# Patient Record
Sex: Female | Born: 1960 | Race: White | Hispanic: No | Smoking: Never smoker
Health system: Southern US, Community
[De-identification: ages and names within clinical notes are randomized; demographics above are authoritative.]

## PROBLEM LIST (undated history)

## (undated) DIAGNOSIS — N3946 Mixed incontinence: Secondary | ICD-10-CM

## (undated) DIAGNOSIS — F419 Anxiety disorder, unspecified: Secondary | ICD-10-CM

## (undated) DIAGNOSIS — E669 Obesity, unspecified: Secondary | ICD-10-CM

## (undated) DIAGNOSIS — F32A Depression, unspecified: Secondary | ICD-10-CM

## (undated) DIAGNOSIS — N3281 Overactive bladder: Secondary | ICD-10-CM

## (undated) DIAGNOSIS — I00 Rheumatic fever without heart involvement: Secondary | ICD-10-CM

## (undated) DIAGNOSIS — L920 Granuloma annulare: Secondary | ICD-10-CM

## (undated) DIAGNOSIS — F329 Major depressive disorder, single episode, unspecified: Secondary | ICD-10-CM

## (undated) HISTORY — DX: Rheumatic fever without heart involvement: I00

## (undated) HISTORY — DX: Anxiety disorder, unspecified: F41.9

## (undated) HISTORY — DX: Mixed incontinence: N39.46

## (undated) HISTORY — DX: Major depressive disorder, single episode, unspecified: F32.9

## (undated) HISTORY — DX: Overactive bladder: N32.81

## (undated) HISTORY — DX: Obesity, unspecified: E66.9

## (undated) HISTORY — PX: NASAL SINUS SURGERY: SHX719

## (undated) HISTORY — DX: Depression, unspecified: F32.A

## (undated) HISTORY — DX: Granuloma annulare: L92.0

---

## 2004-06-27 ENCOUNTER — Ambulatory Visit: Payer: Self-pay | Admitting: Unknown Physician Specialty

## 2005-06-30 ENCOUNTER — Ambulatory Visit: Payer: Self-pay | Admitting: Unknown Physician Specialty

## 2006-07-02 ENCOUNTER — Ambulatory Visit: Payer: Self-pay | Admitting: Unknown Physician Specialty

## 2007-07-04 ENCOUNTER — Ambulatory Visit: Payer: Self-pay | Admitting: Unknown Physician Specialty

## 2008-07-06 ENCOUNTER — Ambulatory Visit: Payer: Self-pay | Admitting: Unknown Physician Specialty

## 2008-07-08 ENCOUNTER — Ambulatory Visit: Payer: Self-pay | Admitting: Unknown Physician Specialty

## 2009-07-12 ENCOUNTER — Ambulatory Visit: Payer: Self-pay | Admitting: Unknown Physician Specialty

## 2010-07-28 ENCOUNTER — Ambulatory Visit: Payer: Self-pay | Admitting: Unknown Physician Specialty

## 2011-11-09 ENCOUNTER — Ambulatory Visit: Payer: Self-pay | Admitting: Gastroenterology

## 2011-11-14 ENCOUNTER — Ambulatory Visit: Payer: Self-pay | Admitting: Unknown Physician Specialty

## 2013-09-11 HISTORY — PX: COLONOSCOPY: SHX174

## 2016-03-09 ENCOUNTER — Encounter: Payer: Self-pay | Admitting: Primary Care

## 2016-03-09 ENCOUNTER — Ambulatory Visit (INDEPENDENT_AMBULATORY_CARE_PROVIDER_SITE_OTHER): Payer: Managed Care, Other (non HMO) | Admitting: Primary Care

## 2016-03-09 VITALS — BP 110/70 | HR 63 | Temp 97.8°F | Ht 62.0 in | Wt 200.4 lb

## 2016-03-09 DIAGNOSIS — Z7189 Other specified counseling: Secondary | ICD-10-CM

## 2016-03-09 DIAGNOSIS — F32A Depression, unspecified: Secondary | ICD-10-CM | POA: Insufficient documentation

## 2016-03-09 DIAGNOSIS — F418 Other specified anxiety disorders: Secondary | ICD-10-CM

## 2016-03-09 DIAGNOSIS — F329 Major depressive disorder, single episode, unspecified: Secondary | ICD-10-CM | POA: Insufficient documentation

## 2016-03-09 DIAGNOSIS — F419 Anxiety disorder, unspecified: Secondary | ICD-10-CM

## 2016-03-09 DIAGNOSIS — IMO0002 Reserved for concepts with insufficient information to code with codable children: Secondary | ICD-10-CM

## 2016-03-09 MED ORDER — ATOVAQUONE-PROGUANIL HCL 250-100 MG PO TABS: 1.0000 | ORAL_TABLET | Freq: Every day | ORAL | Status: DC

## 2016-03-09 MED ORDER — TYPHOID VACCINE PO CPDR: 1.0000 | DELAYED_RELEASE_CAPSULE | ORAL | Status: DC

## 2016-03-09 NOTE — Progress Notes (Signed)
Subjective:    Patient ID: Bridget Villegas, female    DOB: Feb 03, 1961, 55 y.o.   MRN: [id number]  HPI  Bridget Villegas is a 55 year old female who presents today to establish care and discuss the problems mentioned below. Will obtain old records.  1) Anxiety and Depression: Currently managed on Zoloft 100 mg, Rexulti 2 mg, and Clonazepam 0.5 mg. She is currently followed by Psychiatry (Dr. Maryruth BunKapur) and also with therapy. Long history of anxiety and depression since adolescence, moreso worse since 2012 after the passing of her father. Follows with Dr. Maryruth BunKapur every 3 months, and therapy once monthly. Overall she feels well managed.  2) Travel Vaccinations: She will be leaving for a mission trip to [place] on 03/26/16 and would like to discuss prophylactic vaccinations. [facility]. 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  Review of Systems  Constitutional: Negative for unexpected weight change.  Respiratory: Negative for shortness of breath.   Cardiovascular: Negative for chest pain.  Skin: Negative for rash.  Psychiatric/Behavioral: Negative for suicidal ideas. The patient is not nervous/anxious.        Past Medical History  Diagnosis Date  . Anxiety and depression   . Obesity   . Rheumatic fever      Social History   Social History  . Marital Status: Legally Separated    Spouse Name: N/A  . Number of Children: N/A  . Years of Education: N/A   Occupational History  . Not on file.   Social History Main Topics  . Smoking status: Never Smoker   . Smokeless tobacco: Not on file  . Alcohol Use: No  . Drug Use: No  . Sexual Activity: Not on file   Other Topics Concern  . Not on file   Social History Narrative   Divorced.   Twin girls, 1 son.   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    Past Surgical History    Procedure Laterality Date  . Cesarean section      Family History  Problem Relation Age of Onset  . Heart attack Father   . Hypertension Mother   . Alzheimer's disease Mother   . Heart disease Father   . Diabetes Father     No Known Allergies  No current outpatient prescriptions on file prior to visit.   No current facility-administered medications on file prior to visit.    BP 110/70 mmHg  Pulse 63  Temp(Src) 97.8 F (36.6 C) (Oral)  Ht 5\' 2"  (1.575 m)  Wt 200 lb 6.4 oz (90.901 kg)  BMI 36.64 kg/m2  SpO2 98%    Objective:   Physical Exam  Constitutional: She appears well-nourished.  Neck: Neck supple.  Cardiovascular: Normal rate and regular rhythm.   Pulmonary/Chest: Effort normal and breath sounds normal.  Skin: Skin is warm and dry.  Psychiatric: She has a normal mood and affect.          Assessment & Plan:  Travel vaccinations:  Will be traveling to [place] in mid July for mission trip. [facility]. Will provide her with prophylaxis for typhoid and malaria as she will be at risk. Prescription provided for Malarone course for her to start 2 days prior to her trip in complete 1 week after she returns. Perception provided for typhoid capsules  for her to start now. Discussed specific instructions regarding vaccinations, she verbalized understanding.  Morrie Sheldonlark,Dorsie Sethi Kendal, NP

## 2016-03-09 NOTE — Patient Instructions (Signed)
Start Malarone tablet for malaria prevention. Take 1 tablet by mouth daily, starting 2 days before travel, then continue during and after trip until complete.  Start the Typhoid capsules. Take 1 capsule by mouth on day 1,3,5,7. Start now.  Please schedule a physical with me within the next 6 months. You may also schedule a lab only appointment 3-4 days prior. We will discuss your lab results in detail during your physical.  It was a pleasure to meet you today! Please don't hesitate to call me with any questions. Welcome to [facility]!

## 2016-03-09 NOTE — Progress Notes (Signed)
Pre visit review using our clinic review tool, if applicable. No additional management support is needed unless otherwise documented below in the visit note. 

## 2016-03-09 NOTE — Assessment & Plan Note (Signed)
Long history of that dates back to adolescence, more so prevalent since 2012 after loss of her father. Currently following with psychiatry and therapy and feels well managed.

## 2016-10-13 ENCOUNTER — Encounter: Payer: Self-pay | Admitting: Primary Care

## 2016-10-13 ENCOUNTER — Ambulatory Visit (INDEPENDENT_AMBULATORY_CARE_PROVIDER_SITE_OTHER): Payer: Managed Care, Other (non HMO) | Admitting: Primary Care

## 2016-10-13 VITALS — BP 126/70 | HR 71 | Temp 98.1°F | Ht 62.0 in | Wt 212.1 lb

## 2016-10-13 DIAGNOSIS — R0981 Nasal congestion: Secondary | ICD-10-CM

## 2016-10-13 NOTE — Patient Instructions (Signed)
Nasal Congestion/Sinus Pressure: Try using Flonase (fluticasone) nasal spray. Instill 1 spray in each nostril twice daily.   Cough/Congestion: Try taking Mucinex DM. This will help loosen up the mucous in your chest. Ensure you take this medication with a full glass of water. You can also try Delsym.  Please notify me if you develop persistent fevers of 101, start coughing up green mucous, notice increased fatigue or weakness, or feel worse after 1 week of onset of symptoms.   Increase consumption of water intake and rest.  It was a pleasure to see you today!

## 2016-10-13 NOTE — Progress Notes (Signed)
Pre visit review using our clinic review tool, if applicable. No additional management support is needed unless otherwise documented below in the visit note. 

## 2016-10-13 NOTE — Progress Notes (Signed)
Subjective:    Patient ID: SOKHA CRAKER, female    DOB: 1960-09-21, 56 y.o.   MRN: [id number]  HPI  Ms. Morre is a 56 year old female who presents today with a chief complaint of nasal congestion. She also reports cough, headache, sore throat. Fatigue. Her symptoms began 3 days ago. She's blowing clear mucous from her nasal cavity. She denies fevers, sick contacts. She's taken ibuprofen for her headache and OTC tylenol sinus OTC without much improvement.   Review of Systems  Constitutional: Positive for fatigue. Negative for chills and fever.  HENT: Positive for congestion, ear pain, postnasal drip and sinus pressure.   Respiratory: Positive for cough. Negative for shortness of breath.   Cardiovascular: Negative for chest pain.  Neurological: Positive for headaches.       Past Medical History:  Diagnosis Date  . Anxiety and depression   . Obesity   . Rheumatic fever      Social History   Social History  . Marital status: Legally Separated    Spouse name: N/A  . Number of children: N/A  . Years of education: N/A   Occupational History  . Not on file.   Social History Main Topics  . Smoking status: Never Smoker  . Smokeless tobacco: Never Used  . Alcohol use No  . Drug use: No  . Sexual activity: Not on file   Other Topics Concern  . Not on file   Social History Narrative   Divorced.   Twin girls, 1 son.   Works as a Engineer, materials   Enjoys swimming, walking, spending time with friends.    Past Surgical History:  Procedure Laterality Date  . CESAREAN SECTION      Family History  Problem Relation Age of Onset  . Heart attack Father   . Hypertension Mother   . Alzheimer's disease Mother   . Heart disease Father   . Diabetes Father     No Known Allergies  Current Outpatient Prescriptions on File Prior to Visit  Medication Sig Dispense Refill  . atovaquone-proguanil (MALARONE) 250-100 MG TABS tablet Take 1 tablet by mouth daily. 16  tablet 0  . B Complex Vitamins (VITAMIN-B COMPLEX) TABS Take 1 tablet by mouth daily.     Marland Kitchen BIOTIN 5000 PO Take by mouth.    . clonazePAM (KLONOPIN) 0.5 MG tablet Take 0.25 mg by mouth daily.     Marland Kitchen REXULTI 2 MG TABS Take 1 tablet by mouth daily.     . sertraline (ZOLOFT) 100 MG tablet Take 100 mg by mouth daily.     . typhoid (VIVOTIF) DR capsule Take 1 capsule by mouth every other day. 4 capsule 0  . vitamin A ([employer] VITAMIN A) 10000 UNIT capsule Take 10,000 Units by mouth daily.      No current facility-administered medications on file prior to visit.     BP 126/70   Pulse 71   Temp 98.1 F (36.7 C) (Oral)   Ht 5\' 2"  (1.575 m)   Wt 212 lb 1.9 oz (96.2 kg)   SpO2 98%   BMI 38.80 kg/m    Objective:   Physical Exam  Constitutional: She appears well-nourished.  HENT:  Right Ear: Ear canal normal. Tympanic membrane is retracted. Tympanic membrane is not erythematous.  Left Ear: Ear canal normal. Tympanic membrane is bulging. Tympanic membrane is not erythematous.  Nose: Mucosal edema present. Right sinus exhibits no maxillary sinus tenderness and no frontal sinus tenderness. Left  sinus exhibits no maxillary sinus tenderness and no frontal sinus tenderness.  Mouth/Throat: Oropharynx is clear and moist.  Eyes: Conjunctivae are normal.  Neck: Neck supple.  Cardiovascular: Normal rate and regular rhythm.   Pulmonary/Chest: Effort normal and breath sounds normal. She has no wheezes. She has no rales.  Lymphadenopathy:    She has no cervical adenopathy.  Skin: Skin is warm and dry.          Assessment & Plan:  URI:  Nasal congestion, cough, headaches x 3 days. Exam today with nasal mucosal edema, no signs of bacterial involvement. Lungs clear, vitals stable. Suspect allergy vs viral involvement. Discussed use of Flonase, Mucinex, Delsym. Return precautions provided.  Morrie Sheldonlark,Adalyna Godbee Kendal, NP

## 2016-11-28 ENCOUNTER — Encounter: Payer: Self-pay | Admitting: Obstetrics and Gynecology

## 2016-11-28 ENCOUNTER — Ambulatory Visit (INDEPENDENT_AMBULATORY_CARE_PROVIDER_SITE_OTHER): Payer: Managed Care, Other (non HMO) | Admitting: Obstetrics and Gynecology

## 2016-11-28 VITALS — BP 122/72 | HR 65 | Ht 62.5 in | Wt 216.0 lb

## 2016-11-28 DIAGNOSIS — Z01419 Encounter for gynecological examination (general) (routine) without abnormal findings: Secondary | ICD-10-CM

## 2016-11-28 DIAGNOSIS — N3941 Urge incontinence: Secondary | ICD-10-CM

## 2016-11-28 DIAGNOSIS — Z124 Encounter for screening for malignant neoplasm of cervix: Secondary | ICD-10-CM

## 2016-11-28 DIAGNOSIS — Z1231 Encounter for screening mammogram for malignant neoplasm of breast: Secondary | ICD-10-CM | POA: Diagnosis not present

## 2016-11-28 DIAGNOSIS — Z1151 Encounter for screening for human papillomavirus (HPV): Secondary | ICD-10-CM

## 2016-11-28 DIAGNOSIS — Z1239 Encounter for other screening for malignant neoplasm of breast: Secondary | ICD-10-CM

## 2016-11-28 DIAGNOSIS — N3281 Overactive bladder: Secondary | ICD-10-CM

## 2016-11-28 NOTE — Progress Notes (Signed)
HPI:      [name] is a 56 y.o. No obstetric history on file. who LMP was No LMP recorded. Patient is postmenopausal., presents today for her annual examination.  Her menses are absent due to menopause. She does not have intermenstrual bleeding.  She does not have vasomotor sx.   Sex activity: not sexually active. She does not have vaginal dryness.  Last Pap: September 09, 2013  Results were: no abnormalities /neg HPV DNA.  Hx of STDs: none  Last mammogram: September 24, 2015  Results were: normal--routine follow-up in 12 months There is no FH of breast cancer. There is no FH of ovarian cancer. The patient does do self-breast exams.  Colonoscopy: colonoscopy 3 years ago without abnormalities.   Tobacco use: The patient denies current or previous tobacco use. Alcohol use: social drinker Exercise: moderately active  She does get adequate calcium and Vitamin D in her diet.  She complains of urinary urgency/OAB sx with urge incont. Sx have increased the past few months. She occas has SUI sx with cough. She drinks a lot of caffeine.   Past Medical History:  Diagnosis Date  . Anxiety and depression   . Obesity   . Rheumatic fever     Past Surgical History:  Procedure Laterality Date  . CESAREAN SECTION    . NASAL SINUS SURGERY      Family History  Problem Relation Age of Onset  . Heart attack Father   . Heart disease Father   . Diabetes Father   . Hypertension Mother   . Alzheimer's disease Mother      ROS:  Review of Systems  Constitutional: Negative for fever, malaise/fatigue and weight loss.  HENT: Negative for congestion, ear pain and sinus pain.   Respiratory: Negative for cough, shortness of breath and wheezing.   Cardiovascular: Negative for chest pain, orthopnea and leg swelling.  Gastrointestinal: Negative for constipation, diarrhea, nausea and vomiting.  Genitourinary: Negative for dysuria, frequency, hematuria and urgency.       Breast ROS:  negative   Musculoskeletal: Negative for back pain, joint pain and myalgias.  Skin: Negative for itching and rash.  Neurological: Negative for dizziness, tingling, focal weakness and headaches.  Endo/Heme/Allergies: Negative for environmental allergies. Does not bruise/bleed easily.  Psychiatric/Behavioral: Negative for depression and suicidal ideas. The patient is not nervous/anxious and does not have insomnia.     Objective: BP 122/72   Pulse 65   Ht 5' 2.5" (1.588 m)   Wt 216 lb (98 kg)   BMI 38.88 kg/m    Physical Exam  Constitutional: She is oriented to person, place, and time. She appears well-developed and well-nourished.  Genitourinary: Vagina normal and uterus normal. No erythema or tenderness in the vagina. No vaginal discharge found. Right adnexum does not display mass and does not display tenderness. Left adnexum does not display mass and does not display tenderness. Cervix does not exhibit motion tenderness or polyp. Uterus is not enlarged or tender.  Neck: Normal range of motion. No thyromegaly present.  Cardiovascular: Normal rate, regular rhythm and normal heart sounds.   No murmur heard. Pulmonary/Chest: Effort normal and breath sounds normal. Right breast exhibits no mass, no nipple discharge, no skin change and no tenderness. Left breast exhibits no mass, no nipple discharge, no skin change and no tenderness.  Abdominal: Soft. There is no tenderness. There is no guarding.  Musculoskeletal: Normal range of motion.  Neurological: She is alert and oriented to person,  place, and time. No cranial nerve deficit.  Psychiatric: She has a normal mood and affect. Her behavior is normal.  Vitals reviewed.   Assessment/Plan:  Encounter for annual routine gynecological examination  Cervical cancer screening - Plan: IGP, Aptima HPV  Screening for HPV (human papillomavirus) - Plan: IGP, Aptima HPV  Screening for breast cancer - Pt to sched mammo. - Plan: MM DIGITAL  SCREENING BILATERAL  OAB (overactive bladder) - D/C caffeine. F/u prn. Pt not interested in meds.  Urge incontinence           GYN counsel mammography screening, adequate intake of calcium and vitamin D, diet and exercise     F/U  Return in about 1 year (around 11/28/2017).  Alicia B. Copland, PA-C 11/28/2016 4:03 PM

## 2016-12-02 LAB — IGP, APTIMA HPV
HPV APTIMA: NEGATIVE
PAP Smear Comment: 0

## 2016-12-26 ENCOUNTER — Ambulatory Visit: Admission: RE | Admit: 2016-12-26 | Payer: Managed Care, Other (non HMO) | Source: Ambulatory Visit

## 2017-01-23 ENCOUNTER — Ambulatory Visit
Admission: RE | Admit: 2017-01-23 | Discharge: 2017-01-23 | Disposition: A | Discharge status: Home / Self Care | Payer: Managed Care, Other (non HMO) | Source: Ambulatory Visit | Attending: Obstetrics and Gynecology | Admitting: Obstetrics and Gynecology

## 2017-01-23 DIAGNOSIS — Z1231 Encounter for screening mammogram for malignant neoplasm of breast: Secondary | ICD-10-CM | POA: Diagnosis not present

## 2017-01-23 DIAGNOSIS — Z1239 Encounter for other screening for malignant neoplasm of breast: Secondary | ICD-10-CM

## 2017-01-25 ENCOUNTER — Other Ambulatory Visit: Payer: Self-pay | Admitting: Obstetrics and Gynecology

## 2017-01-25 DIAGNOSIS — R928 Other abnormal and inconclusive findings on diagnostic imaging of breast: Secondary | ICD-10-CM

## 2017-01-25 DIAGNOSIS — N6310 Unspecified lump in the right breast, unspecified quadrant: Secondary | ICD-10-CM

## 2017-01-30 ENCOUNTER — Ambulatory Visit
Admission: RE | Admit: 2017-01-30 | Discharge: 2017-01-30 | Disposition: A | Discharge status: Home / Self Care | Payer: Managed Care, Other (non HMO) | Source: Ambulatory Visit | Attending: Obstetrics and Gynecology | Admitting: Obstetrics and Gynecology

## 2017-01-30 ENCOUNTER — Inpatient Hospital Stay
Admission: RE | Admit: 2017-01-30 | Discharge: 2017-01-30 | Disposition: A | Discharge status: Home / Self Care | Payer: Self-pay | Source: Ambulatory Visit | Attending: *Deleted | Admitting: *Deleted

## 2017-01-30 ENCOUNTER — Other Ambulatory Visit: Payer: Self-pay | Admitting: *Deleted

## 2017-01-30 ENCOUNTER — Encounter: Payer: Self-pay | Admitting: Obstetrics and Gynecology

## 2017-01-30 DIAGNOSIS — N6310 Unspecified lump in the right breast, unspecified quadrant: Secondary | ICD-10-CM | POA: Diagnosis present

## 2017-01-30 DIAGNOSIS — R928 Other abnormal and inconclusive findings on diagnostic imaging of breast: Secondary | ICD-10-CM

## 2017-01-30 DIAGNOSIS — Z9289 Personal history of other medical treatment: Secondary | ICD-10-CM

## 2017-05-25 ENCOUNTER — Ambulatory Visit (INDEPENDENT_AMBULATORY_CARE_PROVIDER_SITE_OTHER): Payer: Managed Care, Other (non HMO) | Admitting: Primary Care

## 2017-05-25 VITALS — BP 116/76 | HR 62 | Temp 98.1°F | Ht 62.5 in | Wt 216.1 lb

## 2017-05-25 DIAGNOSIS — B354 Tinea corporis: Secondary | ICD-10-CM

## 2017-05-25 MED ORDER — FLUCONAZOLE 150 MG PO TABS: ORAL_TABLET | ORAL | 0 refills | Status: DC

## 2017-05-25 NOTE — Patient Instructions (Signed)
Start fluconazole 150 mg tablet for ringworm. Take 1 tablet by mouth once weekly for four weeks.  You may continue to use the over-the-counter cream.   Please notify me if no improvement in 2-3 weeks.  It was a pleasure to see you today!

## 2017-05-25 NOTE — Progress Notes (Signed)
Subjective:    Patient ID: Bridget Villegas, female    DOB: [birth year], 56 y.o.   MRN: [id number]  HPI  Bridget Villegas is a 56 year old female who presents today with a chief complaint of rash. Her rash has been intermittent since May/June 2018. She went to [facility] this Summer, prescribed an antifungal cream for which she could not afford. Her rash is located to the bilateral lower extremities. She's been using OTC antifungal creams with temporary improvement. She denies itching, pain. No one else in her household has this rash.   Review of Systems  Constitutional: Negative for fever.  HENT: Negative for congestion.   Respiratory: Negative for wheezing.   Skin: Positive for rash.       Past Medical History:  Diagnosis Date  . Anxiety and depression   . Obesity   . Rheumatic fever      Social History   Social History  . Marital status: Legally Separated    Spouse name: N/A  . Number of children: N/A  . Years of education: N/A   Occupational History  . Not on file.   Social History Main Topics  . Smoking status: Never Smoker  . Smokeless tobacco: Never Used  . Alcohol use No  . Drug use: No  . Sexual activity: No   Other Topics Concern  . Not on file   Social History Narrative   Divorced.   Twin girls, 1 son.   Works as a Engineer, materials   Enjoys swimming, walking, spending time with friends.    Past Surgical History:  Procedure Laterality Date  . CESAREAN SECTION    . NASAL SINUS SURGERY      Family History  Problem Relation Age of Onset  . Heart attack Father   . Heart disease Father   . Diabetes Father   . Hypertension Mother   . Alzheimer's disease Mother   . Breast cancer Neg Hx     No Known Allergies  Current Outpatient Prescriptions on File Prior to Visit  Medication Sig Dispense Refill  . B Complex Vitamins (VITAMIN-B COMPLEX) TABS Take 1 tablet by mouth daily.     Marland Kitchen BIOTIN 5000 PO Take by mouth.    . clonazePAM (KLONOPIN) 0.5 MG  tablet Take 0.25 mg by mouth daily.     Marland Kitchen REXULTI 2 MG TABS Take 1 tablet by mouth daily.     . sertraline (ZOLOFT) 100 MG tablet Take 100 mg by mouth daily.     . vitamin A ([facility] VITAMIN A) 10000 UNIT capsule Take 10,000 Units by mouth daily.      No current facility-administered medications on file prior to visit.     BP 116/76   Pulse 62   Temp 98.1 F (36.7 C) (Oral)   Ht 5' 2.5" (1.588 m)   Wt 216 lb 1.9 oz (98 kg)   SpO2 99%   BMI 38.90 kg/m    Objective:   Physical Exam  Constitutional: She appears well-nourished.  Neck: Neck supple.  Cardiovascular: Normal rate.   Pulmonary/Chest: Effort normal.  Skin: Skin is warm and dry.  3.5 cm, 1 cm, 0.5 cm circular, mildly raised red rash to right anterior and medial lower extremity distal to knee. 1 cm circular, mildly raised red rash to left lower extremity.          Assessment & Plan:  Tinea Corporis:  Located to lower extremities since May 2018. Temporary improvement with OTC products,  could not afford prescribed cream from urgent care. Exam today consistent for tinea corporis.  Given no resolve with OTC products and inability to afford Rx cream, will treat with oral medication. Rx for fluconazole 150 mg once weekly for 4 weeks. She will update in 2-3 weeks if no improvement.  Morrie Sheldon, NP

## 2017-06-18 ENCOUNTER — Telehealth: Payer: Self-pay

## 2017-06-18 NOTE — Telephone Encounter (Signed)
Pt left v/m; pt was seen 05/25/17 with ringworm and pt is no better; pt wants to know if will need refill or will pt get new rx. [facility]. Pt request cb.

## 2017-06-18 NOTE — Telephone Encounter (Signed)
Did she notice any improvement at all while on fluconazole? Did she have any problems with the medication?

## 2017-06-19 NOTE — Telephone Encounter (Signed)
Spoken and notified patient of Kate's comments.  Follow up on 06/21/2017

## 2017-06-19 NOTE — Telephone Encounter (Signed)
Since she's had no resolve with fluconazole we will need to be more aggressive with treatment. First I need to check her liver and kidney function and would like to see her ringworm. Please schedule her for follow up at her convenience.

## 2017-06-19 NOTE — Telephone Encounter (Signed)
Spoken and notified patient of Kate's comments. Patient stated that she noticed a little improvement. She did not have any problems.

## 2017-06-21 ENCOUNTER — Ambulatory Visit: Payer: Managed Care, Other (non HMO) | Admitting: Primary Care

## 2017-06-22 ENCOUNTER — Ambulatory Visit (INDEPENDENT_AMBULATORY_CARE_PROVIDER_SITE_OTHER): Payer: Managed Care, Other (non HMO) | Admitting: Primary Care

## 2017-06-22 ENCOUNTER — Encounter: Payer: Self-pay | Admitting: Primary Care

## 2017-06-22 ENCOUNTER — Other Ambulatory Visit: Payer: Self-pay | Admitting: Primary Care

## 2017-06-22 VITALS — BP 120/76 | HR 74 | Temp 98.4°F | Ht 62.5 in | Wt 214.4 lb

## 2017-06-22 DIAGNOSIS — B354 Tinea corporis: Secondary | ICD-10-CM | POA: Diagnosis not present

## 2017-06-22 LAB — COMPREHENSIVE METABOLIC PANEL
ALBUMIN: 4 g/dL (ref 3.5–5.2)
ALK PHOS: 72 U/L (ref 39–117)
ALT: 12 U/L (ref 0–35)
AST: 13 U/L (ref 0–37)
BILIRUBIN TOTAL: 0.8 mg/dL (ref 0.2–1.2)
BUN: 9 mg/dL (ref 6–23)
CO2: 29 mEq/L (ref 19–32)
CREATININE: 0.6 mg/dL (ref 0.40–1.20)
Calcium: 8.5 mg/dL (ref 8.4–10.5)
Chloride: 103 mEq/L (ref 96–112)
GFR: 108.92 mL/min (ref >60.00)
Glucose, Bld: 95 mg/dL (ref 70–99)
Potassium: 3.9 mEq/L (ref 3.5–5.1)
SODIUM: 140 meq/L (ref 135–145)
TOTAL PROTEIN: 6.8 g/dL (ref 6.0–8.3)

## 2017-06-22 MED ORDER — TERBINAFINE HCL 250 MG PO TABS
250.0000 mg | ORAL_TABLET | Freq: Every day | ORAL | 0 refills | Status: AC
Start: 2017-06-22 — End: 2017-08-03

## 2017-06-22 NOTE — Progress Notes (Signed)
   Subjective:    Patient ID: Bridget Villegas, female    DOB: 22-Oct-1960, 56 y.o.   MRN: [id number]  HPI  Bridget Villegas is a 56 year old female who presents today for follow up of rash. She was last evaluated on 05/25/17 with a chief complaint of a three month history of rash. She was diagnosed with tinea corporis earlier that summer and had failed treatment on OTC and Rx creams. During her visit on 09/14 she was treated with Fluconazole 150 mg once weekly x 4 weeks.  Since her last visit she's noticed some improvement but no resolve. She thinks the rash looks lighter and more scaly. She denies itching, pain, open lesions. No one else in her home has this rash.   Review of Systems  Constitutional: Negative for fever.  Skin: Positive for rash.       Past Medical History:  Diagnosis Date  . Anxiety and depression   . Obesity   . Rheumatic fever      Social History   Social History  . Marital status: Legally Separated    Spouse name: N/A  . Number of children: N/A  . Years of education: N/A   Occupational History  . Not on file.   Social History Main Topics  . Smoking status: Never Smoker  . Smokeless tobacco: Never Used  . Alcohol use No  . Drug use: No  . Sexual activity: No   Other Topics Concern  . Not on file   Social History Narrative   Divorced.   Twin girls, 1 son.   Works as a Engineer, materials   Enjoys swimming, walking, spending time with friends.    Past Surgical History:  Procedure Laterality Date  . CESAREAN SECTION    . NASAL SINUS SURGERY      Family History  Problem Relation Age of Onset  . Heart attack Father   . Heart disease Father   . Diabetes Father   . Hypertension Mother   . Alzheimer's disease Mother   . Breast cancer Neg Hx     No Known Allergies  Current Outpatient Prescriptions on File Prior to Visit  Medication Sig Dispense Refill  . B Complex Vitamins (VITAMIN-B COMPLEX) TABS Take 1 tablet by mouth daily.     Marland Kitchen  BIOTIN 5000 PO Take by mouth.    . clonazePAM (KLONOPIN) 0.5 MG tablet Take 0.25 mg by mouth daily.     Marland Kitchen REXULTI 2 MG TABS Take 1 tablet by mouth daily.     . sertraline (ZOLOFT) 100 MG tablet Take 100 mg by mouth daily.     . vitamin A ([facility]S VITAMIN A) 10000 UNIT capsule Take 10,000 Units by mouth daily.      No current facility-administered medications on file prior to visit.     BP 120/76   Pulse 74   Temp 98.4 F (36.9 C) (Oral)   Ht 5' 2.5" (1.588 m)   Wt 214 lb 6.4 oz (97.3 kg)   SpO2 97%   BMI 38.59 kg/m    Objective:   Physical Exam  Constitutional: She appears well-nourished.  Neck: Neck supple.  Cardiovascular: Normal rate.   Pulmonary/Chest: Effort normal.  Skin: Skin is warm and dry.     Several ring shaped rashes with erythema, mildly raised, lighter center, non tender. No drainage.           Assessment & Plan:

## 2017-06-22 NOTE — Patient Instructions (Signed)
Complete lab work prior to leaving today. I will notify you of your results once received.   You will be contacted regarding your referral to Dermatology.  Please let us know if you have not heard back within one week.   Start terbinafine 250 mg capsules for rash. Take 1 capsule by mouth once daily for 2 weeks.   It was a pleasure to see you today!

## 2017-06-22 NOTE — Assessment & Plan Note (Signed)
Exam still consistent for tinea corporis.  Some improvement on fluconazole treatment, no resolve. Check LFT's today, then will send in Rx for terbinafine treatment x 2 weeks. Referral placed to dermatology for further evaluation if no resolve.

## 2017-08-07 ENCOUNTER — Encounter: Payer: Self-pay | Admitting: Primary Care

## 2017-09-24 ENCOUNTER — Encounter: Payer: Self-pay | Admitting: Primary Care

## 2017-09-24 ENCOUNTER — Ambulatory Visit (INDEPENDENT_AMBULATORY_CARE_PROVIDER_SITE_OTHER): Payer: Managed Care, Other (non HMO) | Admitting: Primary Care

## 2017-09-24 ENCOUNTER — Other Ambulatory Visit (INDEPENDENT_AMBULATORY_CARE_PROVIDER_SITE_OTHER): Payer: Managed Care, Other (non HMO)

## 2017-09-24 VITALS — BP 110/70 | HR 72 | Temp 98.1°F | Wt 215.5 lb

## 2017-09-24 DIAGNOSIS — R5383 Other fatigue: Secondary | ICD-10-CM | POA: Insufficient documentation

## 2017-09-24 LAB — CBC
HCT: 44.5 % (ref 36.0–46.0)
Hemoglobin: 14.8 g/dL (ref 12.0–15.0)
MCHC: 33.2 g/dL (ref 30.0–36.0)
MCV: 95.1 fl (ref 78.0–100.0)
Platelets: 399 10*3/uL (ref 150.0–400.0)
RBC: 4.68 Mil/uL (ref 3.87–5.11)
RDW: 13.7 % (ref 11.5–15.5)
WBC: 7.5 10*3/uL (ref 4.0–10.5)

## 2017-09-24 LAB — TSH: TSH: 3.52 u[IU]/mL (ref 0.35–4.50)

## 2017-09-24 LAB — VITAMIN D 25 HYDROXY (VIT D DEFICIENCY, FRACTURES): VITD: 28.04 ng/mL — AB (ref 30.00–100.00)

## 2017-09-24 LAB — VITAMIN B12: Vitamin B-12: 962 pg/mL — ABNORMAL HIGH (ref 211–911)

## 2017-09-24 NOTE — Assessment & Plan Note (Signed)
Noticed significant improvement since getting B 12 injections monthly through a weight loss clinic. Will check B 12 levels today, consider monthly injections if needed. Also check TSH, CBC.

## 2017-09-24 NOTE — Progress Notes (Signed)
Subjective:    Patient ID: Bridget Villegas, female    DOB: 07-23-1961, 57 y.o.   MRN: [id number]  HPI  Bridget Villegas is a 57 year old female who presents today requesting vitamin B 12 injections. She's been getting B12 injections monthly through a weight loss clinic at [facility]. She's been doing this since September 2018 and since then has noticed more energy, is mentally more focused, and is feeling overall better. Her last B12 injection was in November 2018 and has noticed a decrease in overall engery levels. She's not had a B12 lab level drawn. She denies palpitations, dizziness, weakness.   Review of Systems  Respiratory: Negative for shortness of breath.   Cardiovascular: Negative for chest pain and palpitations.  Neurological: Negative for dizziness and weakness.       Past Medical History:  Diagnosis Date  . Anxiety and depression   . Granuloma annulare   . Obesity   . Rheumatic fever      Social History   Socioeconomic History  . Marital status: Legally Separated    Spouse name: Not on file  . Number of children: Not on file  . Years of education: Not on file  . Highest education level: Not on file  Social Needs  . Financial resource strain: Not on file  . Food insecurity - worry: Not on file  . Food insecurity - inability: Not on file  . Transportation needs - medical: Not on file  . Transportation needs - non-medical: Not on file  Occupational History  . Not on file  Tobacco Use  . Smoking status: Never Smoker  . Smokeless tobacco: Never Used  Substance and Sexual Activity  . Alcohol use: No    Alcohol/week: 0.0 oz  . Drug use: No  . Sexual activity: No  Other Topics Concern  . Not on file  Social History Narrative   Divorced.   Twin girls, 1 son.   Works as a Engineer, materials   Enjoys swimming, walking, spending time with friends.    Past Surgical History:  Procedure Laterality Date  . CESAREAN SECTION    . NASAL SINUS SURGERY       Family History  Problem Relation Age of Onset  . Heart attack Father   . Heart disease Father   . Diabetes Father   . Hypertension Mother   . Alzheimer's disease Mother   . Breast cancer Neg Hx     No Known Allergies  Current Outpatient Medications on File Prior to Visit  Medication Sig Dispense Refill  . BIOTIN 5000 PO Take by mouth.    . cholecalciferol (VITAMIN D) 1000 units tablet Take 1,000 Units by mouth daily.    . clonazePAM (KLONOPIN) 0.5 MG tablet Take 0.25 mg by mouth daily.     . cyanocobalamin (,VITAMIN B-12,) 1000 MCG/ML injection Inject 1,000 mcg into the muscle every 30 (thirty) days.    Marland Kitchen REXULTI 2 MG TABS Take 1 tablet by mouth daily.     . sertraline (ZOLOFT) 100 MG tablet Take 100 mg by mouth daily.     . vitamin A ([facility] VITAMIN A) 10000 UNIT capsule Take 10,000 Units by mouth daily.     . B Complex Vitamins (VITAMIN-B COMPLEX) TABS Take 1 tablet by mouth daily.      No current facility-administered medications on file prior to visit.     BP 110/70   Pulse 72   Temp 98.1 F (36.7 C) (Oral)  Wt 215 lb 8 oz (97.8 kg)   BMI 38.79 kg/m    Objective:   Physical Exam  Constitutional: She appears well-nourished.  Neck: Neck supple.  Cardiovascular: Normal rate and regular rhythm.  Pulmonary/Chest: Effort normal and breath sounds normal.  Skin: Skin is warm and dry.          Assessment & Plan:

## 2017-09-24 NOTE — Patient Instructions (Signed)
Stop by the lab prior to leaving today. I will notify you of your results once received.   Continue to work on a healthy diet and start regular exercise.   It was a pleasure to see you today!

## 2017-09-25 ENCOUNTER — Telehealth: Payer: Self-pay | Admitting: Primary Care

## 2017-09-25 ENCOUNTER — Encounter: Payer: Self-pay | Admitting: *Deleted

## 2017-09-25 NOTE — Telephone Encounter (Signed)
Phone call from pt. to receive lab results.  Result note was not forwarded to the [facility].  Advised of results and recommendations per Mayra ReelKate Clark, NP.  Pt. verb. understanding of starting Vitamin D 1000 units/ day, and that B12 injections are not recommended, given her recent level.  No further questions.

## 2017-09-25 NOTE — Telephone Encounter (Signed)
Copied from CRM [id number]. Topic: Quick Communication - Lab Results >> Sep 25, 2017  2:29 PM Caryn SectionMorris, Nelma Phagan E, NT wrote: Pt called and wanted to see if her lab results were in and would like a call back.

## 2017-09-25 NOTE — Telephone Encounter (Signed)
Left v/m for pt to cb. CRM done so PEC can speak with pt.

## 2017-09-25 NOTE — Telephone Encounter (Signed)
Noted. Will check result note.

## 2018-02-13 ENCOUNTER — Ambulatory Visit (INDEPENDENT_AMBULATORY_CARE_PROVIDER_SITE_OTHER): Payer: Managed Care, Other (non HMO) | Admitting: Obstetrics and Gynecology

## 2018-02-13 ENCOUNTER — Encounter: Payer: Self-pay | Admitting: Obstetrics and Gynecology

## 2018-02-13 VITALS — BP 120/72 | HR 66 | Ht 62.5 in | Wt 221.0 lb

## 2018-02-13 DIAGNOSIS — Z1231 Encounter for screening mammogram for malignant neoplasm of breast: Secondary | ICD-10-CM

## 2018-02-13 DIAGNOSIS — Z1239 Encounter for other screening for malignant neoplasm of breast: Secondary | ICD-10-CM

## 2018-02-13 DIAGNOSIS — Z0000 Encounter for general adult medical examination without abnormal findings: Secondary | ICD-10-CM

## 2018-02-13 DIAGNOSIS — N3941 Urge incontinence: Secondary | ICD-10-CM

## 2018-02-13 DIAGNOSIS — N3281 Overactive bladder: Secondary | ICD-10-CM | POA: Insufficient documentation

## 2018-02-13 DIAGNOSIS — Z01419 Encounter for gynecological examination (general) (routine) without abnormal findings: Secondary | ICD-10-CM

## 2018-02-13 MED ORDER — TOLTERODINE TARTRATE 2 MG PO TABS: 2.0000 mg | ORAL_TABLET | Freq: Two times a day (BID) | ORAL | 2 refills | Status: DC

## 2018-02-13 NOTE — Patient Instructions (Addendum)
I value your feedback and entrusting us with your care. If you get a [facility] patient survey, I would appreciate you taking the time to let us know about your experience today. Thank you!  [facility]: [phone]  [facility]: [phone]  

## 2018-02-13 NOTE — Progress Notes (Signed)
Chief Complaint  Patient presents with  . Gynecologic Exam    Trouble controlling bladder      HPI:      [name] is a 57 y.o. [phone] who LMP was No LMP recorded. Patient is postmenopausal., presents today for her annual examination. Her menses are absent due to menopause. She does not have intermenstrual bleeding.  She does not have vasomotor sx.   Sex activity: not sexually active. She does not have vaginal dryness.  Last Pap: 11/29/16 Results were: no abnormalities /neg HPV DNA.  Hx of STDs: none  Last mammogram: 01/30/17  Results were: normal--routine follow-up in 12 months There is no FH of breast cancer. There is no FH of ovarian cancer. The patient does do self-breast exams.  Colonoscopy: colonoscopy 4 years ago without abnormalities. Repeat due in 10 yrs.   Tobacco use: The patient denies current or previous tobacco use. Alcohol use: social drinker Exercise: moderately active  She does get adequate calcium and Vitamin D in her diet.  She complains of urinary urgency/OAB sx with urge incont. Sx have increased since last yr. Meds discussed last yr but pt didn't want them then, but sx very bothersome now. No change with d/c caffeine use/kegel exercises.  She occas has SUI sx with cough. No hx of glaucoma.  Past Medical History:  Diagnosis Date  . Anxiety and depression   . Granuloma annulare   . Obesity   . Rheumatic fever     Past Surgical History:  Procedure Laterality Date  . CESAREAN SECTION    . NASAL SINUS SURGERY      Family History  Problem Relation Age of Onset  . Heart attack Father   . Heart disease Father   . Diabetes Father   . Hypertension Mother   . Alzheimer's disease Mother   . Breast cancer Neg Hx     Social History   Socioeconomic History  . Marital status: Legally Separated    Spouse name: Not on file  . Number of children: Not on file  . Years of education: Not on file  . Highest education level: Not on  file  Occupational History  . Not on file  Social Needs  . Financial resource strain: Not on file  . Food insecurity:    Worry: Not on file    Inability: Not on file  . Transportation needs:    Medical: Not on file    Non-medical: Not on file  Tobacco Use  . Smoking status: Never Smoker  . Smokeless tobacco: Never Used  Substance and Sexual Activity  . Alcohol use: No    Alcohol/week: 0.0 oz  . Drug use: No  . Sexual activity: Not Currently    Birth control/protection: Post-menopausal  Lifestyle  . Physical activity:    Days per week: Not on file    Minutes per session: Not on file  . Stress: Not on file  Relationships  . Social connections:    Talks on phone: Not on file    Gets together: Not on file    Attends religious service: Not on file    Active member of club or organization: Not on file    Attends meetings of clubs or organizations: Not on file    Relationship status: Not on file  . Intimate partner violence:    Fear of current or ex partner: Not on file    Emotionally abused: Not on file    Physically abused: Not  on file    Forced sexual activity: Not on file  Other Topics Concern  . Not on file  Social History Narrative   Divorced.   Twin girls, 1 son.   Works as a Engineer, materials   Enjoys swimming, walking, spending time with friends.    Current Outpatient Medications on File Prior to Visit  Medication Sig Dispense Refill  . BIOTIN 5000 PO Take by mouth.    . cholecalciferol (VITAMIN D) 1000 units tablet Take 1,000 Units by mouth daily.    Marland Kitchen REXULTI 2 MG TABS Take 1 tablet by mouth daily.     . sertraline (ZOLOFT) 100 MG tablet Take 100 mg by mouth daily.     . vitamin A ([facility]S VITAMIN A) 10000 UNIT capsule Take 10,000 Units by mouth daily.     . clonazePAM (KLONOPIN) 0.5 MG tablet Take 0.25 mg by mouth daily.     . cyanocobalamin (,VITAMIN B-12,) 1000 MCG/ML injection Inject 1,000 mcg into the muscle every 30 (thirty) days.    . vitamin A  10000 UNIT capsule Take by mouth.     No current facility-administered medications on file prior to visit.       ROS:  Review of Systems  Constitutional: Negative for fatigue, fever and unexpected weight change.  Respiratory: Negative for cough, shortness of breath and wheezing.   Cardiovascular: Negative for chest pain, palpitations and leg swelling.  Gastrointestinal: Negative for blood in stool, constipation, diarrhea, nausea and vomiting.  Endocrine: Negative for cold intolerance, heat intolerance and polyuria.  Genitourinary: Positive for frequency and urgency. Negative for dyspareunia, dysuria, flank pain, genital sores, hematuria, menstrual problem, pelvic pain, vaginal bleeding, vaginal discharge and vaginal pain.  Musculoskeletal: Negative for back pain, joint swelling and myalgias.  Skin: Negative for rash.  Neurological: Negative for dizziness, syncope, light-headedness, numbness and headaches.  Hematological: Negative for adenopathy.  Psychiatric/Behavioral: Negative for agitation, confusion, sleep disturbance and suicidal ideas. The patient is not nervous/anxious.      Objective: BP 120/72   Pulse 66   Ht 5' 2.5" (1.588 m)   Wt 221 lb (100.2 kg)   BMI 39.78 kg/m    Physical Exam  Constitutional: She is oriented to person, place, and time. She appears well-developed and well-nourished.  Genitourinary: Vagina normal and uterus normal. There is no rash or tenderness on the right labia. There is no rash or tenderness on the left labia. No erythema or tenderness in the vagina. No vaginal discharge found. Right adnexum does not display mass and does not display tenderness. Left adnexum does not display mass and does not display tenderness. Cervix does not exhibit motion tenderness or polyp. Uterus is not enlarged or tender.  Neck: Normal range of motion. No thyromegaly present.  Cardiovascular: Normal rate, regular rhythm and normal heart sounds.  No murmur  heard. Pulmonary/Chest: Effort normal and breath sounds normal. Right breast exhibits no mass, no nipple discharge, no skin change and no tenderness. Left breast exhibits no mass, no nipple discharge, no skin change and no tenderness.  Abdominal: Soft. There is no tenderness. There is no guarding.  Musculoskeletal: Normal range of motion.  Neurological: She is alert and oriented to person, place, and time. No cranial nerve deficit.  Psychiatric: She has a normal mood and affect. Her behavior is normal.  Vitals reviewed.   Assessment/Plan: Encounter for annual routine gynecological examination  Screening for breast cancer - Pt to sched mammo - Plan: MM 3D SCREEN BREAST BILATERAL, CANCELED: MM  DIGITAL SCREENING BILATERAL  OAB (overactive bladder) - Try detrol. Rx eRxd. F/u via phone re sx in 2-3 months.  - Plan: tolterodine (DETROL) 2 MG tablet  Urge incontinence - Plan: tolterodine (DETROL) 2 MG tablet  Meds ordered this encounter  Medications  . tolterodine (DETROL) 2 MG tablet    Sig: Take 1 tablet (2 mg total) by mouth 2 (two) times daily.    Dispense:  60 tablet    Refill:  2    Order Specific Question:   Supervising Provider    Answer:   Nadara Mustard [[id number]]             GYN counsel breast self exam, mammography screening, menopause, adequate intake of calcium and vitamin D, diet and exercise, Kegel's exercises     F/U  Return in about 1 year (around 02/14/2019).  Antonieta Slaven B. Basil Blakesley, PA-C 02/13/2018 4:56 PM

## 2018-03-05 ENCOUNTER — Telehealth: Payer: Self-pay

## 2018-03-05 NOTE — Telephone Encounter (Signed)
Pt reports that Detrol isn't working. She thinks it's causing other problems that are interacting with her other medications. YQ#[id number]#[phone]

## 2018-03-05 NOTE — Telephone Encounter (Signed)
Can take 8-12 wks for sx control, but what other problems is she having with her meds? She can always stop detrol and we can try something else. RN to clarify.

## 2018-03-06 NOTE — Telephone Encounter (Signed)
Didn't see anxiety as side effect of detrol when I looked it up. Sx only for past wk and pt has other interpersonal things going on. No other side effects. Cont detrol for now if ok to see if helps OAB sx. If not, will stop and see if anxiety resolved. F/u prn sx.

## 2018-03-06 NOTE — Telephone Encounter (Signed)
Pt states she is having a lot of anxiety, wants to know if this could be some kind of  interreactions with her other meds she takes? Should she continue or try something else?

## 2018-03-06 NOTE — Telephone Encounter (Signed)
lmtrc

## 2018-05-01 ENCOUNTER — Ambulatory Visit (INDEPENDENT_AMBULATORY_CARE_PROVIDER_SITE_OTHER): Payer: Managed Care, Other (non HMO) | Admitting: Internal Medicine

## 2018-05-01 ENCOUNTER — Encounter: Payer: Self-pay | Admitting: Internal Medicine

## 2018-05-01 VITALS — BP 122/84 | HR 75 | Temp 98.5°F | Ht 62.5 in | Wt 217.0 lb

## 2018-05-01 DIAGNOSIS — B349 Viral infection, unspecified: Secondary | ICD-10-CM | POA: Insufficient documentation

## 2018-05-01 MED ORDER — ONDANSETRON HCL 4 MG PO TABS: 4.0000 mg | ORAL_TABLET | Freq: Three times a day (TID) | ORAL | 0 refills | Status: DC | PRN

## 2018-05-01 NOTE — Progress Notes (Signed)
Subjective:    Patient ID: Bridget Villegas, female    DOB: 1960/10/09, 57 y.o.   MRN: [id number]  HPI Here due to "feeling lousy from head to toe" Bad headache Ear pain and sore throat Started 2 days ago Some GI symptoms since yesterday Some vomiting--once this am Diarrhea today--- several loose stools  No fever No chills or sweats No SOB Slight cough recently Some post nasal drip  Tried ibuprofen--no clear help  Current Outpatient Medications on File Prior to Visit  Medication Sig Dispense Refill  . BIOTIN 5000 PO Take by mouth.    . cholecalciferol (VITAMIN D) 1000 units tablet Take 1,000 Units by mouth daily.    . cyanocobalamin (,VITAMIN B-12,) 1000 MCG/ML injection Inject 1,000 mcg into the muscle every 30 (thirty) days.    Marland Kitchen. REXULTI 2 MG TABS Take 1 tablet by mouth daily.     . sertraline (ZOLOFT) 100 MG tablet Take 100 mg by mouth daily.     Marland Kitchen. tolterodine (DETROL) 2 MG tablet Take 1 tablet (2 mg total) by mouth 2 (two) times daily. 60 tablet 2   No current facility-administered medications on file prior to visit.     No Known Allergies  Past Medical History:  Diagnosis Date  . Anxiety and depression   . Granuloma annulare   . Obesity   . Rheumatic fever     Past Surgical History:  Procedure Laterality Date  . CESAREAN SECTION    . NASAL SINUS SURGERY      Family History  Problem Relation Age of Onset  . Heart attack Father   . Heart disease Father   . Diabetes Father   . Hypertension Mother   . Alzheimer's disease Mother   . Breast cancer Neg Hx     Social History   Socioeconomic History  . Marital status: Legally Separated    Spouse name: Not on file  . Number of children: Not on file  . Years of education: Not on file  . Highest education level: Not on file  Occupational History  . Not on file  Social Needs  . Financial resource strain: Not on file  . Food insecurity:    Worry: Not on file    Inability: Not on file  .  Transportation needs:    Medical: Not on file    Non-medical: Not on file  Tobacco Use  . Smoking status: Never Smoker  . Smokeless tobacco: Never Used  Substance and Sexual Activity  . Alcohol use: No    Alcohol/week: 0.0 standard drinks  . Drug use: No  . Sexual activity: Not Currently    Birth control/protection: Post-menopausal  Lifestyle  . Physical activity:    Days per week: Not on file    Minutes per session: Not on file  . Stress: Not on file  Relationships  . Social connections:    Talks on phone: Not on file    Gets together: Not on file    Attends religious service: Not on file    Active member of club or organization: Not on file    Attends meetings of clubs or organizations: Not on file    Relationship status: Not on file  . Intimate partner violence:    Fear of current or ex partner: Not on file    Emotionally abused: Not on file    Physically abused: Not on file    Forced sexual activity: Not on file  Other Topics Concern  .  Not on file  Social History Narrative   Divorced.   Twin girls, 1 son.   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   Review of Systems  Grandson has had mild URI symptoms No known strep exposure No rash     Objective:   Physical Exam  Constitutional: She appears well-developed. No distress.  Looks tired but no distress  HENT:  No sinus tenderness TMs normal Mild nasal congestion No pharyngeal injection or exudate  Neck: No thyromegaly present.  Respiratory: Effort normal and breath sounds normal. No respiratory distress. She has no wheezes. She has no rales.  GI: Soft. She exhibits no distension. There is no rebound and no guarding.  Slight periumbilical tenderness  Lymphadenopathy:    She has no cervical adenopathy.           Assessment & Plan:

## 2018-05-01 NOTE — Assessment & Plan Note (Signed)
Likely adenovirus or other similar with mild respiratory and some GI Discussed supportive care Ondansetron prn RTW note---missed 3 days this week

## 2018-05-22 ENCOUNTER — Ambulatory Visit
Admission: RE | Admit: 2018-05-22 | Discharge: 2018-05-22 | Disposition: A | Discharge status: Home / Self Care | Payer: Managed Care, Other (non HMO) | Source: Ambulatory Visit | Attending: Obstetrics and Gynecology | Admitting: Obstetrics and Gynecology

## 2018-05-22 DIAGNOSIS — Z1231 Encounter for screening mammogram for malignant neoplasm of breast: Secondary | ICD-10-CM | POA: Insufficient documentation

## 2018-05-22 DIAGNOSIS — Z1239 Encounter for other screening for malignant neoplasm of breast: Secondary | ICD-10-CM

## 2018-05-23 ENCOUNTER — Encounter: Payer: Self-pay | Admitting: Obstetrics and Gynecology

## 2018-06-06 ENCOUNTER — Ambulatory Visit (INDEPENDENT_AMBULATORY_CARE_PROVIDER_SITE_OTHER): Payer: Managed Care, Other (non HMO) | Admitting: Internal Medicine

## 2018-06-06 ENCOUNTER — Encounter: Payer: Self-pay | Admitting: Internal Medicine

## 2018-06-06 VITALS — BP 122/84 | HR 72 | Temp 98.4°F | Wt 213.0 lb

## 2018-06-06 DIAGNOSIS — B9789 Other viral agents as the cause of diseases classified elsewhere: Secondary | ICD-10-CM | POA: Diagnosis not present

## 2018-06-06 DIAGNOSIS — J329 Chronic sinusitis, unspecified: Secondary | ICD-10-CM | POA: Diagnosis not present

## 2018-06-06 MED ORDER — METHYLPREDNISOLONE ACETATE 80 MG/ML IJ SUSP
80.0000 mg | Freq: Once | INTRAMUSCULAR | Status: AC
Start: 2018-06-06 — End: 2018-06-06
  Administered 2018-06-06: 80 mg via INTRAMUSCULAR

## 2018-06-06 NOTE — Addendum Note (Signed)
Addended by: Roena Malady on: 06/06/2018 12:42 PM   Modules accepted: Orders

## 2018-06-06 NOTE — Patient Instructions (Signed)

## 2018-06-06 NOTE — Progress Notes (Signed)
HPI  Pt presents to the clinic today with c/o headache, ear pain and nasal congestion. She reports this started 4 days ago. The headache is located in the forehead and temples. She describes the pain as dull and achy. She denies dizziness, visual changes. She describes the ear pain as fullness. She is blowing green mucous out of her nose. She denies sore throat, cough, or shortness of breath. She Trissa fever, chills or body aches. She has tried Ibuprofen and Mucinex as needed with minimal relief. She has no history of allergies. She has not had sick contacts that she is aware of.  Review of Systems     Past Medical History:  Diagnosis Date  . Anxiety and depression   . Granuloma annulare   . Obesity   . Rheumatic fever     Family History  Problem Relation Age of Onset  . Heart attack Father   . Heart disease Father   . Diabetes Father   . Hypertension Mother   . Alzheimer's disease Mother   . Breast cancer Neg Hx     Social History   Socioeconomic History  . Marital status: Legally Separated    Spouse name: Not on file  . Number of children: Not on file  . Years of education: Not on file  . Highest education level: Not on file  Occupational History  . Not on file  Social Needs  . Financial resource strain: Not on file  . Food insecurity:    Worry: Not on file    Inability: Not on file  . Transportation needs:    Medical: Not on file    Non-medical: Not on file  Tobacco Use  . Smoking status: Never Smoker  . Smokeless tobacco: Never Used  Substance and Sexual Activity  . Alcohol use: No    Alcohol/week: 0.0 standard drinks  . Drug use: No  . Sexual activity: Not Currently    Birth control/protection: Post-menopausal  Lifestyle  . Physical activity:    Days per week: Not on file    Minutes per session: Not on file  . Stress: Not on file  Relationships  . Social connections:    Talks on phone: Not on file    Gets together: Not on file    Attends religious  service: Not on file    Active member of club or organization: Not on file    Attends meetings of clubs or organizations: Not on file    Relationship status: Not on file  . Intimate partner violence:    Fear of current or ex partner: Not on file    Emotionally abused: Not on file    Physically abused: Not on file    Forced sexual activity: Not on file  Other Topics Concern  . Not on file  Social History Narrative   Divorced.   Twin girls, 1 son.   Works as a Engineer, materials   Enjoys swimming, walking, spending time with friends.    No Known Allergies   Constitutional: Positive headache. Denies fatigue, fever or abrupt weight changes.  HEENT:  Positive ear pain, nasal congestion. Denies eye redness,  ringing in the ears, wax buildup, runny nose or sore throat. Respiratory: Denies cough, difficulty breathing or shortness of breath.  Cardiovascular: Denies chest pain, chest tightness, palpitations or swelling in the hands or feet.   No other specific complaints in a complete review of systems (except as listed in HPI above).  Objective:   BP  122/84   Pulse 72   Temp 98.4 F (36.9 C) (Oral)   Wt 213 lb (96.6 kg)   SpO2 97%   BMI 38.34 kg/m   General: Appears her stated age, obese in NAD. HEENT: Head: normal shape and size, no sinus tenderness noted; Ears: Tm's cloudy but intact, normal light reflex; Nose: mucosa pink and moist, septum midline; Throat/Mouth: + PND. Teeth present, mucosa pink and moist, no exudate noted, no lesions or ulcerations noted.  Neck:  No adenopathy noted.  Pulmonary/Chest: Normal effort and positive vesicular breath sounds. No respiratory distress. No wheezes, rales or ronchi noted.       Assessment & Plan:   Viral Sinusitis  Can use a Neti Pot which can be purchased from your local drug store. Flonase 2 sprays each nostril for 3 days and then as needed. Start Zyrtec OTC 80 mg Depo IM today Work note provided  RTC as needed or if  symptoms persist. Nicki Reaper, NP

## 2018-06-27 ENCOUNTER — Ambulatory Visit: Payer: Managed Care, Other (non HMO) | Admitting: Family Medicine

## 2018-07-22 ENCOUNTER — Ambulatory Visit (INDEPENDENT_AMBULATORY_CARE_PROVIDER_SITE_OTHER): Payer: Managed Care, Other (non HMO) | Admitting: Family Medicine

## 2018-07-22 ENCOUNTER — Encounter: Payer: Self-pay | Admitting: Family Medicine

## 2018-07-22 VITALS — BP 128/72 | HR 69 | Temp 98.6°F | Ht 62.0 in | Wt 219.2 lb

## 2018-07-22 DIAGNOSIS — H66001 Acute suppurative otitis media without spontaneous rupture of ear drum, right ear: Secondary | ICD-10-CM | POA: Diagnosis not present

## 2018-07-22 DIAGNOSIS — H5789 Other specified disorders of eye and adnexa: Secondary | ICD-10-CM

## 2018-07-22 DIAGNOSIS — H1031 Unspecified acute conjunctivitis, right eye: Secondary | ICD-10-CM

## 2018-07-22 MED ORDER — POLYMYXIN B-TRIMETHOPRIM 10000-0.1 UNIT/ML-% OP SOLN: 1.0000 [drp] | OPHTHALMIC | 0 refills | Status: AC

## 2018-07-22 MED ORDER — AMOXICILLIN 875 MG PO TABS: 875.0000 mg | ORAL_TABLET | Freq: Two times a day (BID) | ORAL | 0 refills | Status: DC

## 2018-07-22 NOTE — Progress Notes (Signed)
Subjective:    Patient ID: Bridget Villegas, female    DOB: 02-23-1961, 57 y.o.   MRN: [id number]  HPI  Presents to clinic c/o swollen eye, with crusting and discharge for 1 day, and also pain in both ears, right more than left with sinus congestion for the past 1 week.  Patient recently spent the weekend with her young grandchildren, one grandson did have congestion/cold symptoms.  Denies fever or chills.  Denies nausea, vomiting or diarrhea.  Denies cough with phlegm.  Denies shortness of breath or wheezing.  Patient Active Problem List   Diagnosis Date Noted  . Viral infection 05/01/2018  . OAB (overactive bladder) 02/13/2018  . Urge incontinence 02/13/2018  . Fatigue 09/24/2017  . Tinea corporis 06/22/2017  . Anxiety and depression 03/09/2016   Social History   Tobacco Use  . Smoking status: Never Smoker  . Smokeless tobacco: Never Used  Substance Use Topics  . Alcohol use: No    Alcohol/week: 0.0 standard drinks    Review of Systems  Constitutional: Negative for chills, fatigue and fever.  HENT: +eye swollen, congestion in nose, ear pain  Eyes: Negative.   Respiratory: Negative for cough, shortness of breath and wheezing.   Cardiovascular: Negative for chest pain, palpitations and leg swelling.  Gastrointestinal: Negative for abdominal pain, diarrhea, nausea and vomiting.  Genitourinary: Negative for dysuria, frequency and urgency.  Musculoskeletal: Negative for arthralgias and myalgias.  Skin: Negative for color change, pallor and rash.  Neurological: Negative for syncope, light-headedness and headaches.  Psychiatric/Behavioral: The patient is not nervous/anxious.       Objective:   Physical Exam  Constitutional: She is oriented to person, place, and time. She appears well-nourished. No distress.  HENT:  Head: Normocephalic and atraumatic.  Right Ear: Tympanic membrane is injected, erythematous and bulging. A middle ear effusion is present.  Left Ear: A middle  ear effusion is present.  Nose: Mucosal edema and rhinorrhea present.  +post nasal drip  Eyes: Pupils are equal, round, and reactive to light. EOM are normal. Right eye exhibits discharge. Left eye exhibits no discharge. No scleral icterus.  Right conjunctivae puffy and red with tan discharge from eye  Neck: Neck supple. No tracheal deviation present.  Cardiovascular: Normal rate and regular rhythm.  Pulmonary/Chest: Effort normal and breath sounds normal. No respiratory distress. She has no wheezes. She has no rales.  Musculoskeletal: She exhibits no edema.  Gait normal  Neurological: She is alert and oriented to person, place, and time. No cranial nerve deficit.  Skin: Skin is warm and dry. No pallor.  Psychiatric: Her behavior is normal.  Nursing note and vitals reviewed.     Vitals:   07/22/18 1628  BP: 128/72  Pulse: 69  Temp: 98.6 F (37 C)  SpO2: 98%   Assessment & Plan:   Acute otitis media of right ear- patient will take amoxicillin 875 twice daily for 10 days.  Advised to begin taking Zyrtec 10 mg once daily this will help dry up nasal and ear congestion.  She use Tylenol as needed for pain.  Advised to rest, increase fluids, do good handwashing.  Discharge from right eye/conjunctivitis of right eye-patient will use Polytrim drops in right eye.  Advised to avoid rubbing eye, change her pillowcase, dispose of any recently used eye make-up, do good handwashing and sanitize commonly touch surfaces in her home.  Keep regular follow-up as planned with PCP.  Return to clinic sooner if any issues arise or if current  symptoms persist or worsen.

## 2018-07-22 NOTE — Patient Instructions (Signed)
Take zyrtec (generic form OK) once per day  Avoid rubbing eye. Change pillow case. Dispose of eye makeup. Do good handwashing.

## 2018-07-23 ENCOUNTER — Encounter: Payer: Self-pay | Admitting: Family Medicine

## 2018-09-17 ENCOUNTER — Encounter: Payer: Self-pay | Admitting: Primary Care

## 2018-09-17 ENCOUNTER — Ambulatory Visit (INDEPENDENT_AMBULATORY_CARE_PROVIDER_SITE_OTHER): Payer: Managed Care, Other (non HMO) | Admitting: Primary Care

## 2018-09-17 VITALS — BP 132/76 | HR 70 | Temp 98.4°F | Ht 62.0 in | Wt 214.0 lb

## 2018-09-17 DIAGNOSIS — R51 Headache: Secondary | ICD-10-CM | POA: Diagnosis not present

## 2018-09-17 DIAGNOSIS — B9789 Other viral agents as the cause of diseases classified elsewhere: Secondary | ICD-10-CM | POA: Diagnosis not present

## 2018-09-17 DIAGNOSIS — J069 Acute upper respiratory infection, unspecified: Secondary | ICD-10-CM | POA: Diagnosis not present

## 2018-09-17 DIAGNOSIS — R519 Headache, unspecified: Secondary | ICD-10-CM

## 2018-09-17 MED ORDER — METHYLPREDNISOLONE ACETATE 80 MG/ML IJ SUSP
80.0000 mg | Freq: Once | INTRAMUSCULAR | Status: AC
  Administered 2018-09-17: 80 mg via INTRAMUSCULAR

## 2018-09-17 NOTE — Progress Notes (Signed)
Subjective:    Patient ID: Bridget Villegas, female    DOB: 1961-07-11, 58 y.o.   MRN: [id number]  HPI  Bridget Villegas is a 58 year old female who presents today with a chief complaint of headache.  She also reports ear pressure, cough, sore throat, nasal congestion. Her symptoms began 5 days ago. She's been under a lot of stress at work, worried she may lose her job. She's tried taking Mucinex and Ibuprofen without much improvement. Her most bothersome symptom is a bilateral frontal headache. She denies fevers, photophobia, nausea.   Review of Systems  Constitutional: Positive for chills. Negative for fever.  HENT: Positive for congestion. Negative for sinus pressure.        Ear pressure  Respiratory: Positive for cough. Negative for shortness of breath and wheezing.   Neurological: Positive for headaches.       Past Medical History:  Diagnosis Date  . Anxiety and depression   . Granuloma annulare   . Obesity   . Rheumatic fever      Social History   Socioeconomic History  . Marital status: Legally Separated    Spouse name: Not on file  . Number of children: Not on file  . Years of education: Not on file  . Highest education level: Not on file  Occupational History  . Not on file  Social Needs  . Financial resource strain: Not on file  . Food insecurity:    Worry: Not on file    Inability: Not on file  . Transportation needs:    Medical: Not on file    Non-medical: Not on file  Tobacco Use  . Smoking status: Never Smoker  . Smokeless tobacco: Never Used  Substance and Sexual Activity  . Alcohol use: No    Alcohol/week: 0.0 standard drinks  . Drug use: No  . Sexual activity: Not Currently    Birth control/protection: Post-menopausal  Lifestyle  . Physical activity:    Days per week: Not on file    Minutes per session: Not on file  . Stress: Not on file  Relationships  . Social connections:    Talks on phone: Not on file    Gets together: Not on file   Attends religious service: Not on file    Active member of club or organization: Not on file    Attends meetings of clubs or organizations: Not on file    Relationship status: Not on file  . Intimate partner violence:    Fear of current or ex partner: Not on file    Emotionally abused: Not on file    Physically abused: Not on file    Forced sexual activity: Not on file  Other Topics Concern  . Not on file  Social History Narrative   Divorced.   Twin girls, 1 son.   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    Past Surgical History:  Procedure Laterality Date  . CESAREAN SECTION    . NASAL SINUS SURGERY      Family History  Problem Relation Age of Onset  . Heart attack Father   . Heart disease Father   . Diabetes Father   . Hypertension Mother   . Alzheimer's disease Mother   . Breast cancer Neg Hx     No Known Allergies  Current Outpatient Medications on File Prior to Visit  Medication Sig Dispense Refill  . cholecalciferol (VITAMIN D) 1000 units  tablet Take 1,000 Units by mouth daily.    . cyanocobalamin (,VITAMIN B-12,) 1000 MCG/ML injection Inject 1,000 mcg into the muscle every 30 (thirty) days.    Marland Kitchen. REXULTI 2 MG TABS Take 1 tablet by mouth daily.     . sertraline (ZOLOFT) 100 MG tablet Take 100 mg by mouth daily.      No current facility-administered medications on file prior to visit.     BP 132/76   Pulse 70   Temp 98.4 F (36.9 C) (Oral)   Ht 5\' 2"  (1.575 m)   Wt 214 lb (97.1 kg)   SpO2 98%   BMI 39.14 kg/m    Objective:   Physical Exam  Constitutional: She appears well-nourished. She does not appear ill.  HENT:  Right Ear: Tympanic membrane and ear canal normal.  Left Ear: Tympanic membrane and ear canal normal.  Nose: Mucosal edema present. Right sinus exhibits no maxillary sinus tenderness and no frontal sinus tenderness. Left sinus exhibits no maxillary sinus tenderness and no frontal sinus  tenderness.  Mouth/Throat: Oropharynx is clear and moist.  Neck: Neck supple.  Cardiovascular: Normal rate and regular rhythm.  Respiratory: Effort normal and breath sounds normal. She has no wheezes.  Skin: Skin is warm and dry.           Assessment & Plan:  URI:  Cough, congestion, headache x 5 days. Overall little improvement with OTC treatment. Respiratory exam overall unremarkable. Vitals stable. Suspect viral URI and will treat with conservative measures.  IM Depo Medrol 80 mg provided for headache/pressure. Discussed use of Robitussin/Delsym, antihistamine. Return precautions provided.  Doreene NestKatherine K Jacquelyn Antony, NP

## 2018-09-17 NOTE — Addendum Note (Signed)
Addended by: Tawnya Crook on: 09/17/2018 12:27 PM   Modules accepted: Orders

## 2018-09-17 NOTE — Patient Instructions (Signed)
Your symptoms are representative of a viral illness which will resolve on its own over time. Our goal is to treat your symptoms in order to aid your body in the healing process and to make you more comfortable.   You can try a few things over the counter to help with your symptoms including:  Cough: Delsym or Robitussin (get the off brand, works just as well) Chest Congestion: Mucinex (plain) Nasal Congestion/Ear Pressure/Sinus Pressure: Try using Flonase (fluticasone) nasal spray. Instill 1 spray in each nostril twice daily. This can be purchased over the counter. Ibuprofen or Tylenol: Body aches, fevers, headache.  Please notify me if you are not improving after one full week of symptoms, start running fevers consistently over 101, and/or feel worse.  It was a pleasure to see you today!

## 2018-12-03 ENCOUNTER — Encounter: Payer: Self-pay | Admitting: Primary Care

## 2018-12-03 ENCOUNTER — Telehealth (INDEPENDENT_AMBULATORY_CARE_PROVIDER_SITE_OTHER): Payer: Managed Care, Other (non HMO) | Admitting: Primary Care

## 2018-12-03 ENCOUNTER — Other Ambulatory Visit: Payer: Self-pay

## 2018-12-03 DIAGNOSIS — R05 Cough: Secondary | ICD-10-CM | POA: Diagnosis not present

## 2018-12-03 DIAGNOSIS — J302 Other seasonal allergic rhinitis: Secondary | ICD-10-CM | POA: Insufficient documentation

## 2018-12-03 DIAGNOSIS — B9789 Other viral agents as the cause of diseases classified elsewhere: Secondary | ICD-10-CM

## 2018-12-03 DIAGNOSIS — J069 Acute upper respiratory infection, unspecified: Secondary | ICD-10-CM | POA: Insufficient documentation

## 2018-12-03 NOTE — Assessment & Plan Note (Signed)
Recent symptoms began last night, seem more so like allergy involvement but consider viral URI as differential. Discussed to start antihistamine daily.  Okay to take Aleve for headache if needed.  Recommended Delsym or Robitussin for cough.  Discussed that symptoms may get worse before they get better, she verbalized understanding.  She will call with an update in 1 week if symptoms progress.  Work note provided for her to work from home this week given symptoms. 

## 2018-12-03 NOTE — Progress Notes (Signed)
[name] is a 58 year old female who presents today via phone with a chief complaint of sore throat. She has consented for this phone visit. She is at home, I am in the office.  She also reports postnasal drip, cough, headaches. Symptoms began last night. She's been around her grandson who has had a runny nose but is otherwise healthy. She denies fevers, shortness of breath, recent travel. She's not taken anything for her symptoms but she has Aleve and a "mucous medication" at home. She is supposed to go into work this week, called out today.  She is requesting a work note to work from home this week.  Phone call lasted 7 minutes and 22 seconds.

## 2018-12-03 NOTE — Assessment & Plan Note (Signed)
Recent symptoms began last night, seem more so like allergy involvement but consider viral URI as differential. Discussed to start antihistamine daily.  Okay to take Aleve for headache if needed.  Recommended Delsym or Robitussin for cough.  Discussed that symptoms may get worse before they get better, she verbalized understanding.  She will call with an update in 1 week if symptoms progress.  Work note provided for her to work from home this week given symptoms.

## 2018-12-03 NOTE — Patient Instructions (Signed)
Your symptoms are representative of allergies/viral involvement.  Start an antihistamine such as Zyrtec/Claritin/Allegra once daily for throat drainage, cough, runny nose.  You can take Aleve as needed for headaches.  Try Delsym or Robitussin for cough, these may be purchased over-the-counter.  Please notify me if you develop persistent fevers of 101, develop shortness of breath or increased shortness of breath, and/or feel worse after 1 week of onset of symptoms.   Nice speaking with you! Mayra Reel, NP-C

## 2019-02-12 ENCOUNTER — Ambulatory Visit (INDEPENDENT_AMBULATORY_CARE_PROVIDER_SITE_OTHER): Payer: Managed Care, Other (non HMO) | Admitting: Primary Care

## 2019-02-12 ENCOUNTER — Other Ambulatory Visit: Payer: Self-pay

## 2019-02-12 VITALS — BP 130/80 | HR 67 | Temp 98.5°F | Ht 62.0 in | Wt 218.5 lb

## 2019-02-12 DIAGNOSIS — L03317 Cellulitis of buttock: Secondary | ICD-10-CM | POA: Diagnosis not present

## 2019-02-12 MED ORDER — DOXYCYCLINE HYCLATE 100 MG PO TABS: 100.0000 mg | ORAL_TABLET | Freq: Two times a day (BID) | ORAL | 0 refills | Status: DC

## 2019-02-12 NOTE — Patient Instructions (Signed)
Start Doxycycline antibiotic for the infection. Take 1 tablet by mouth twice daily for 10 days.  Use gauze in between buttocks for drainage and protection.  Continue Ibuprofen, you can increase to 800 mg every 8 hours as needed.  Schedule a follow up visit for Monday next week.   It was a pleasure to see you today!

## 2019-02-12 NOTE — Progress Notes (Signed)
Subjective:    Patient ID: Bridget Villegas, female    DOB: 1961-03-09, 58 y.o.   MRN: [id number]  HPI  Bridget Villegas is a 58 year old female who presents today with a chief complaint of skin irritation.  Her pain/irritation is located to the right upper buttocks for which she noticed initially late last week. She also endorses redness, pain, whitish-brown drainage. She denies fevers. She's taken Ibuprofen with temporary improvement in pain. She's also been taking warm baths with some relief. She sits for most of the day for her position at work, has been sitting more than usual.   Review of Systems  Constitutional: Negative for fever.  Respiratory: Negative for shortness of breath.   Cardiovascular: Negative for chest pain.  Skin: Positive for color change and wound.       Drainage, right buttocks pain       Past Medical History:  Diagnosis Date  . Anxiety and depression   . Granuloma annulare   . Obesity   . Rheumatic fever      Social History   Socioeconomic History  . Marital status: Legally Separated    Spouse name: Not on file  . Number of children: Not on file  . Years of education: Not on file  . Highest education level: Not on file  Occupational History  . Not on file  Social Needs  . Financial resource strain: Not on file  . Food insecurity:    Worry: Not on file    Inability: Not on file  . Transportation needs:    Medical: Not on file    Non-medical: Not on file  Tobacco Use  . Smoking status: Never Smoker  . Smokeless tobacco: Never Used  Substance and Sexual Activity  . Alcohol use: No    Alcohol/week: 0.0 standard drinks  . Drug use: No  . Sexual activity: Not Currently    Birth control/protection: Post-menopausal  Lifestyle  . Physical activity:    Days per week: Not on file    Minutes per session: Not on file  . Stress: Not on file  Relationships  . Social connections:    Talks on phone: Not on file    Gets together: Not on file   Attends religious service: Not on file    Active member of club or organization: Not on file    Attends meetings of clubs or organizations: Not on file    Relationship status: Not on file  . Intimate partner violence:    Fear of current or ex partner: Not on file    Emotionally abused: Not on file    Physically abused: Not on file    Forced sexual activity: Not on file  Other Topics Concern  . Not on file  Social History Narrative   Divorced.   Twin girls, 1 son.   Works as a Engineer, materials   Enjoys swimming, walking, spending time with friends.    Past Surgical History:  Procedure Laterality Date  . CESAREAN SECTION    . NASAL SINUS SURGERY      Family History  Problem Relation Age of Onset  . Heart attack Father   . Heart disease Father   . Diabetes Father   . Hypertension Mother   . Alzheimer's disease Mother   . Breast cancer Neg Hx     No Known Allergies  Current Outpatient Medications on File Prior to Visit  Medication Sig Dispense Refill  . cholecalciferol (VITAMIN D)  1000 units tablet Take 1,000 Units by mouth daily.    . cyanocobalamin (,VITAMIN B-12,) 1000 MCG/ML injection Inject 1,000 mcg into the muscle every 30 (thirty) days.    Marland Kitchen. REXULTI 2 MG TABS Take 1 tablet by mouth daily.     . sertraline (ZOLOFT) 100 MG tablet Take 100 mg by mouth daily.      No current facility-administered medications on file prior to visit.     BP 130/80   Pulse 67   Temp 98.5 F (36.9 C) (Tympanic)   Ht 5\' 2"  (1.575 m)   Wt 218 lb 8 oz (99.1 kg)   SpO2 98%   BMI 39.96 kg/m    Objective:   Physical Exam  Constitutional: She appears well-nourished.  Cardiovascular: Normal rate.  Respiratory: Effort normal.  Skin: There is erythema.  Cellulitis with open wound and whitish drainage to right upper buttocks with foul smell and moderate erythema. Area of open wound measures 3 cm in circumference. Mild surrounding erythema           Assessment & Plan:

## 2019-02-12 NOTE — Assessment & Plan Note (Signed)
Evident to right upper buttocks. Wound culture collected. Rx for Doxycycline course sent to pharmacy. Discussed home care instructions. Ibuprofen is working well for analgesia.  Follow up Monday in the clinic for re-evaluation.

## 2019-02-15 LAB — WOUND CULTURE
MICRO NUMBER:: 533065
SPECIMEN QUALITY:: ADEQUATE

## 2019-02-17 ENCOUNTER — Encounter: Payer: Self-pay | Admitting: Primary Care

## 2019-02-17 ENCOUNTER — Other Ambulatory Visit: Payer: Self-pay

## 2019-02-17 ENCOUNTER — Ambulatory Visit (INDEPENDENT_AMBULATORY_CARE_PROVIDER_SITE_OTHER): Payer: Managed Care, Other (non HMO) | Admitting: Primary Care

## 2019-02-17 DIAGNOSIS — L03317 Cellulitis of buttock: Secondary | ICD-10-CM | POA: Diagnosis not present

## 2019-02-17 MED ORDER — CEFTRIAXONE SODIUM 1 G IJ SOLR
1.0000 g | Freq: Once | INTRAMUSCULAR | Status: AC
  Administered 2019-02-17: 1 g via INTRAMUSCULAR

## 2019-02-17 NOTE — Progress Notes (Signed)
Subjective:    Patient ID: Bridget Villegas, female    DOB: [birth year], 58 y.o.   MRN: [id number]  HPI  Bridget Villegas is a 58 year old female who presents today for follow up of cellulitis.  She was last evaluated on 02/12/19 with reports of right buttocks pain, drainage, erythema. She was noted to have cellulitis with open wound to right upper buttocks and was treated with Doxycycline course.   Since her last visit she's doing much better. She's noticed significant improvement in discomfort. She's been compliant to her Doxycycline antibiotics and has about five days remaining. She denies fevers. She is dressing her wound with non stick gauze pads.  Review of Systems  Constitutional: Negative for fever.  Respiratory: Negative for shortness of breath.   Skin: Positive for wound.       Past Medical History:  Diagnosis Date  . Anxiety and depression   . Granuloma annulare   . Obesity   . Rheumatic fever      Social History   Socioeconomic History  . Marital status: Legally Separated    Spouse name: Not on file  . Number of children: Not on file  . Years of education: Not on file  . Highest education level: Not on file  Occupational History  . Not on file  Social Needs  . Financial resource strain: Not on file  . Food insecurity:    Worry: Not on file    Inability: Not on file  . Transportation needs:    Medical: Not on file    Non-medical: Not on file  Tobacco Use  . Smoking status: Never Smoker  . Smokeless tobacco: Never Used  Substance and Sexual Activity  . Alcohol use: No    Alcohol/week: 0.0 standard drinks  . Drug use: No  . Sexual activity: Not Currently    Birth control/protection: Post-menopausal  Lifestyle  . Physical activity:    Days per week: Not on file    Minutes per session: Not on file  . Stress: Not on file  Relationships  . Social connections:    Talks on phone: Not on file    Gets together: Not on file    Attends religious service: Not  on file    Active member of club or organization: Not on file    Attends meetings of clubs or organizations: Not on file    Relationship status: Not on file  . Intimate partner violence:    Fear of current or ex partner: Not on file    Emotionally abused: Not on file    Physically abused: Not on file    Forced sexual activity: Not on file  Other Topics Concern  . Not on file  Social History Narrative   Divorced.   Twin girls, 1 son.   Works as a Insurance claims handler   Enjoys swimming, walking, spending time with friends.    Past Surgical History:  Procedure Laterality Date  . CESAREAN SECTION    . NASAL SINUS SURGERY      Family History  Problem Relation Age of Onset  . Heart attack Father   . Heart disease Father   . Diabetes Father   . Hypertension Mother   . Alzheimer's disease Mother   . Breast cancer Neg Hx     No Known Allergies  Current Outpatient Medications on File Prior to Visit  Medication Sig Dispense Refill  . cholecalciferol (VITAMIN D) 1000 units tablet Take 1,000 Units by  mouth daily.    . cyanocobalamin (,VITAMIN B-12,) 1000 MCG/ML injection Inject 1,000 mcg into the muscle every 30 (thirty) days.    Marland Kitchen. doxycycline (VIBRA-TABS) 100 MG tablet Take 1 tablet (100 mg total) by mouth 2 (two) times daily. 20 tablet 0  . REXULTI 2 MG TABS Take 1 tablet by mouth daily.     . sertraline (ZOLOFT) 100 MG tablet Take 100 mg by mouth daily.      No current facility-administered medications on file prior to visit.     BP 128/72   Pulse 68   Temp 98.3 F (36.8 C) (Oral)   Ht 5\' 2"  (1.575 m)   Wt 220 lb 12 oz (100.1 kg)   SpO2 98%   BMI 40.38 kg/m    Objective:   Physical Exam  Constitutional: She appears well-nourished.  Cardiovascular: Normal rate.  Skin:  Mild erythema with 4-5 cm in length firm area to right buttocks. Mild tenderness. Mild whitish drainage. Foul smell. 1 cm open wound/ulcer with scant drainage.            Assessment & Plan:

## 2019-02-17 NOTE — Assessment & Plan Note (Signed)
Improved moderately since treatment last week. There does remain to be a small wound to right buttocks. IM Rocephin provided today. Continue Doxycycline antibiotics. Return precautions provided.

## 2019-02-17 NOTE — Addendum Note (Signed)
Addended by: Jacqualin Combes on: 02/17/2019 09:30 AM   Modules accepted: Orders

## 2019-02-17 NOTE — Patient Instructions (Signed)
Continue taking Doxycycline antibiotics for the infection.  Continue to keep the area warm and dry.  Please notify me if you develop fevers, increased pain, drainage.   It was a pleasure to see you today!

## 2019-02-19 ENCOUNTER — Ambulatory Visit (INDEPENDENT_AMBULATORY_CARE_PROVIDER_SITE_OTHER): Payer: Managed Care, Other (non HMO) | Admitting: Obstetrics and Gynecology

## 2019-02-19 ENCOUNTER — Other Ambulatory Visit: Payer: Self-pay

## 2019-02-19 ENCOUNTER — Encounter: Payer: Self-pay | Admitting: Obstetrics and Gynecology

## 2019-02-19 VITALS — BP 120/80 | HR 79 | Ht 62.5 in | Wt 217.0 lb

## 2019-02-19 DIAGNOSIS — B356 Tinea cruris: Secondary | ICD-10-CM

## 2019-02-19 DIAGNOSIS — Z01419 Encounter for gynecological examination (general) (routine) without abnormal findings: Secondary | ICD-10-CM

## 2019-02-19 DIAGNOSIS — Z1239 Encounter for other screening for malignant neoplasm of breast: Secondary | ICD-10-CM

## 2019-02-19 DIAGNOSIS — N3941 Urge incontinence: Secondary | ICD-10-CM

## 2019-02-19 DIAGNOSIS — N3281 Overactive bladder: Secondary | ICD-10-CM

## 2019-02-19 MED ORDER — OXYBUTYNIN CHLORIDE ER 10 MG PO TB24: 10.0000 mg | ORAL_TABLET | Freq: Every day | ORAL | 3 refills | Status: DC

## 2019-02-19 MED ORDER — CLOTRIMAZOLE-BETAMETHASONE 1-0.05 % EX CREA: TOPICAL_CREAM | CUTANEOUS | 0 refills | Status: DC

## 2019-02-19 NOTE — Patient Instructions (Addendum)
I value your feedback and entrusting us with your care. If you get a [facility] patient survey, I would appreciate you taking the time to let us know about your experience today. Thank you!  [facility]: [phone]    

## 2019-02-19 NOTE — Progress Notes (Signed)
Chief Complaint  Patient presents with  . Gynecologic Exam    c/o urinary incontinence     HPI:      [name] is a 58 y.o. [phone] [id number] who LMP was No LMP recorded. Patient is postmenopausal., presents today for her annual examination. Her menses are absent due to menopause. She does not have intermenstrual bleeding. She does not have vasomotor sx.   Sex activity: not sexually active. She does not have vaginal dryness.  Last Pap: 11/29/16 Results were: no abnormalities /neg HPV DNA.  Hx of STDs: none  Last mammogram: 05/22/18  Results were: normal--routine follow-up in 12 months There is no FH of breast cancer. There is no FH of ovarian cancer. The patient does do self-breast exams. She gets a red rash under her breasts when it gets hot and she sweats more. Rash is itchy at times but very uncomfortable. Tried putting baby powder there.   Colonoscopy: colonoscopy 5 years ago without abnormalities. Repeat due in 10 yrs.   Tobacco use: The patient denies current or previous tobacco use. Alcohol use: social drinker Exercise: moderately active  She does get adequate calcium and Vitamin D in her diet.  She complains of urinary urgency/OAB sx with urge incont. Sx have increased since last yr. Tried detrol last yr for about a month without relief, no side effects. Pt states sx improved on their own but have been bad the last month. She had stopped caffeine in the past and didn't notice a difference but admits to drinking more caffeine lately. Wants to try meds again. She occas has SUI sx with cough. No hx of glaucoma.  Past Medical History:  Diagnosis Date  . Anxiety and depression   . Granuloma annulare   . Obesity   . Rheumatic fever     Past Surgical History:  Procedure Laterality Date  . CESAREAN SECTION    . NASAL SINUS SURGERY      Family History  Problem Relation Age of Onset  . Heart attack Father   . Heart disease Father   . Diabetes Father   .  Hypertension Mother   . Alzheimer's disease Mother   . Breast cancer Neg Hx     Social History   Socioeconomic History  . Marital status: Legally Separated    Spouse name: Not on file  . Number of children: Not on file  . Years of education: Not on file  . Highest education level: Not on file  Occupational History  . Not on file  Social Needs  . Financial resource strain: Not on file  . Food insecurity:    Worry: Not on file    Inability: Not on file  . Transportation needs:    Medical: Not on file    Non-medical: Not on file  Tobacco Use  . Smoking status: Never Smoker  . Smokeless tobacco: Never Used  Substance and Sexual Activity  . Alcohol use: No    Alcohol/week: 0.0 standard drinks  . Drug use: No  . Sexual activity: Not Currently    Birth control/protection: Post-menopausal  Lifestyle  . Physical activity:    Days per week: 2 days    Minutes per session: 50 min  . Stress: Not on file  Relationships  . Social connections:    Talks on phone: Not on file    Gets together: Not on file    Attends religious service: Not on file    Active member of club or  organization: Not on file    Attends meetings of clubs or organizations: Not on file    Relationship status: Not on file  . Intimate partner violence:    Fear of current or ex partner: Not on file    Emotionally abused: Not on file    Physically abused: Not on file    Forced sexual activity: Not on file  Other Topics Concern  . Not on file  Social History Narrative   Divorced.   Twin girls, 1 son.   Works as a Insurance claims handler   Enjoys swimming, walking, spending time with friends.    Current Outpatient Medications on File Prior to Visit  Medication Sig Dispense Refill  . cholecalciferol (VITAMIN D) 1000 units tablet Take 1,000 Units by mouth daily.    Marland Kitchen doxycycline (VIBRA-TABS) 100 MG tablet Take 1 tablet (100 mg total) by mouth 2 (two) times daily. 20 tablet 0  . REXULTI 2 MG TABS Take 1 tablet  by mouth daily.     . sertraline (ZOLOFT) 100 MG tablet Take 100 mg by mouth daily.     . cyanocobalamin (,VITAMIN B-12,) 1000 MCG/ML injection Inject 1,000 mcg into the muscle every 30 (thirty) days.     No current facility-administered medications on file prior to visit.       ROS:  Review of Systems  Constitutional: Negative for fatigue, fever and unexpected weight change.  Respiratory: Negative for cough, shortness of breath and wheezing.   Cardiovascular: Negative for chest pain, palpitations and leg swelling.  Gastrointestinal: Negative for blood in stool, constipation, diarrhea, nausea and vomiting.  Endocrine: Negative for cold intolerance, heat intolerance and polyuria.  Genitourinary: Positive for frequency and urgency. Negative for dyspareunia, dysuria, flank pain, genital sores, hematuria, menstrual problem, pelvic pain, vaginal bleeding, vaginal discharge and vaginal pain.  Musculoskeletal: Negative for back pain, joint swelling and myalgias.  Skin: Positive for rash.  Neurological: Negative for dizziness, syncope, light-headedness, numbness and headaches.  Hematological: Negative for adenopathy.  Psychiatric/Behavioral: Positive for agitation and dysphoric mood. Negative for confusion, sleep disturbance and suicidal ideas. The patient is not nervous/anxious.      Objective: BP 120/80 (BP Location: Left Arm, Patient Position: Sitting, Cuff Size: Normal)   Pulse 79   Ht 5' 2.5" (1.588 m)   Wt 217 lb (98.4 kg)   BMI 39.06 kg/m    Physical Exam Constitutional:      Appearance: She is well-developed.  Genitourinary:     Vulva, vagina, uterus, right adnexa and left adnexa normal.     No vulval lesion or tenderness noted.     No vaginal discharge, erythema or tenderness.     No cervical motion tenderness or polyp.     Uterus is not enlarged or tender.     No right or left adnexal mass present.     Right adnexa not tender.     Left adnexa not tender.  Neck:      Musculoskeletal: Normal range of motion.     Thyroid: No thyromegaly.  Cardiovascular:     Rate and Rhythm: Normal rate and regular rhythm.     Heart sounds: Normal heart sounds. No murmur.  Pulmonary:     Effort: Pulmonary effort is normal.     Breath sounds: Normal breath sounds.  Chest:     Breasts:        Right: No mass, nipple discharge, skin change or tenderness.        Left: No mass, nipple  discharge, skin change or tenderness.  Abdominal:     Palpations: Abdomen is soft.     Tenderness: There is no abdominal tenderness. There is no guarding.  Musculoskeletal: Normal range of motion.  Neurological:     General: No focal deficit present.     Mental Status: She is alert and oriented to person, place, and time.     Cranial Nerves: No cranial nerve deficit.  Skin:    General: Skin is warm and dry.     Findings: Erythema and rash present. Rash is scaling.          Comments: FUNGAL RASH UNDER LT BREAST WITH SKIN BREAKDOWN  Psychiatric:        Mood and Affect: Mood normal.        Behavior: Behavior normal.        Thought Content: Thought content normal.        Judgment: Judgment normal.  Vitals signs reviewed.     Assessment/Plan: Encounter for annual routine gynecological examination  Screening for breast cancer - Pt to sched mammo - Plan: MM 3D SCREEN BREAST BILATERAL  Tinea cruris - Under LT breast. Discussed importance of keeping dry wiht hair dryer, no bra when possible, antifungal powder. Rx lotrisone crm in meantime.  - Plan: clotrimazole-betamethasone (LOTRISONE) cream  OAB (overactive bladder) - D/C CAFFEINE. Rx ditropan. Need to give it 3 months for sx improvement. Will refer to Dr. Tiburcio PeaHarris vs urology if sx don't improve. - Plan: oxybutynin (DITROPAN-XL) 10 MG 24 hr tablet  Urge incontinence - Plan: oxybutynin (DITROPAN-XL) 10 MG 24 hr tablet  Meds ordered this encounter  Medications  . oxybutynin (DITROPAN-XL) 10 MG 24 hr tablet    Sig: Take 1 tablet (10 mg  total) by mouth daily.    Dispense:  90 tablet    Refill:  3    Order Specific Question:   Supervising Provider    Answer:   Nadara MustardHARRIS, ROBERT P [id number]]  . clotrimazole-betamethasone (LOTRISONE) cream    Sig: Apply externally BID prn sx up to 2 wks    Dispense:  45 g    Refill:  0    Order Specific Question:   Supervising Provider    Answer:   Nadara MustardHARRIS, ROBERT P [[id number]]             GYN counsel breast self exam, mammography screening, menopause, adequate intake of calcium and vitamin D, diet and exercise     F/U  Return in about 1 year (around 02/19/2020).  Alicia B. Copland, PA-C 02/19/2019 1:58 PM

## 2019-06-05 ENCOUNTER — Ambulatory Visit
Admission: RE | Admit: 2019-06-05 | Discharge: 2019-06-05 | Disposition: A | Discharge status: Home / Self Care | Payer: Managed Care, Other (non HMO) | Source: Ambulatory Visit | Attending: Obstetrics and Gynecology | Admitting: Obstetrics and Gynecology

## 2019-06-05 DIAGNOSIS — Z1239 Encounter for other screening for malignant neoplasm of breast: Secondary | ICD-10-CM

## 2019-06-05 DIAGNOSIS — Z1231 Encounter for screening mammogram for malignant neoplasm of breast: Secondary | ICD-10-CM | POA: Diagnosis present

## 2019-06-06 ENCOUNTER — Encounter: Payer: Self-pay | Admitting: Obstetrics and Gynecology

## 2019-06-24 ENCOUNTER — Telehealth: Payer: Self-pay

## 2019-06-24 NOTE — Telephone Encounter (Signed)
For 1-2 wks pt has headache with pain level 7-8 when really hurting; now H/A is 4.pt has lightheadedness and feeling off balance on and off. Pt has no weakness but when feeling off balance pt walks to the left.No warning prior to lightheadedness and being off balance. Advised pt she should not drive a car. Pt does not have a way to ck BP. Pt will get someone to take her to UC. FYI to Gentry Fitz NP.

## 2019-06-24 NOTE — Telephone Encounter (Signed)
Noted. Please check on patient tomorrow. I don't see where she is at a [facility] at this time.

## 2019-06-25 NOTE — Telephone Encounter (Signed)
Per DPR, left detail message of Kate Clark's comments for patient to call back 

## 2019-06-26 NOTE — Telephone Encounter (Signed)
Pt left v/m that she went to [place] on 06/24/19 and pt still does not feel good; pt has bad h/a, not as dizzy but still having dizziness and does not feel "right" pt still feels disorientated. Pt wants appt. I spoke with pt. Pt said she was seen on 06/24/19 and was given benadryl and meclizine and that has not helped h/a or dizziness. Pt is not disorientated; pt knows who she is and where she is; pt said it is like she is not thinking clearly or foggy headed. Pt feels lightheaded with sudden movement but the room does not spin. Pt wants to be seen at [facility] and needs notes for both pts jobs. Gentry Fitz NP said if no other covid symptoms except H/A should be OK for pt to come in office. Pt has no covid symptoms except h/a, no travel and no known exposure to + covid.  Pt scheduled in office visit with Gentry Fitz NP on 06/27/19 at 9:40. Ed precautions given and pt voiced understanding. Pt will call [facility] in [place] and have them fax copy of pts office notes,testing and instructions to [phone] atten: Vallarie Mare. FYI to Gentry Fitz NP and Vallarie Mare CMA.

## 2019-06-27 ENCOUNTER — Encounter: Payer: Self-pay | Admitting: Primary Care

## 2019-06-27 ENCOUNTER — Other Ambulatory Visit: Payer: Self-pay

## 2019-06-27 ENCOUNTER — Ambulatory Visit (INDEPENDENT_AMBULATORY_CARE_PROVIDER_SITE_OTHER): Payer: Managed Care, Other (non HMO) | Admitting: Primary Care

## 2019-06-27 DIAGNOSIS — R42 Dizziness and giddiness: Secondary | ICD-10-CM | POA: Insufficient documentation

## 2019-06-27 LAB — CBC
HCT: 46.9 % — ABNORMAL HIGH (ref 36.0–46.0)
Hemoglobin: 15.6 g/dL — ABNORMAL HIGH (ref 12.0–15.0)
MCHC: 33.2 g/dL (ref 30.0–36.0)
MCV: 94.5 fl (ref 78.0–100.0)
Platelets: 324 10*3/uL (ref 150.0–400.0)
RBC: 4.97 Mil/uL (ref 3.87–5.11)
RDW: 13.9 % (ref 11.5–15.5)
WBC: 6.9 10*3/uL (ref 4.0–10.5)

## 2019-06-27 LAB — COMPREHENSIVE METABOLIC PANEL
ALT: 12 U/L (ref 0–35)
AST: 14 U/L (ref 0–37)
Albumin: 4.3 g/dL (ref 3.5–5.2)
Alkaline Phosphatase: 81 U/L (ref 39–117)
BUN: 13 mg/dL (ref 6–23)
CO2: 27 mEq/L (ref 19–32)
Calcium: 9.4 mg/dL (ref 8.4–10.5)
Chloride: 103 mEq/L (ref 96–112)
Creatinine, Ser: 0.66 mg/dL (ref 0.40–1.20)
GFR: 91.18 mL/min (ref >60.00)
Glucose, Bld: 106 mg/dL — ABNORMAL HIGH (ref 70–99)
Potassium: 4.3 mEq/L (ref 3.5–5.1)
Sodium: 140 mEq/L (ref 135–145)
Total Bilirubin: 1.3 mg/dL — ABNORMAL HIGH (ref 0.2–1.2)
Total Protein: 6.8 g/dL (ref 6.0–8.3)

## 2019-06-27 LAB — TSH: TSH: 3.51 u[IU]/mL (ref 0.35–4.50)

## 2019-06-27 LAB — SEDIMENTATION RATE: Sed Rate: 13 mm/hr (ref 0–30)

## 2019-06-27 LAB — LIPID PANEL
Cholesterol: 222 mg/dL — ABNORMAL HIGH (ref 0–200)
HDL: 56.5 mg/dL (ref >39.00)
LDL Cholesterol: 142 mg/dL — ABNORMAL HIGH (ref 0–99)
NonHDL: 165.23
Total CHOL/HDL Ratio: 4
Triglycerides: 115 mg/dL (ref 0.0–149.0)
VLDL: 23 mg/dL (ref 0.0–40.0)

## 2019-06-27 LAB — HEMOGLOBIN A1C: Hgb A1c MFr Bld: 5.5 % (ref 4.6–6.5)

## 2019-06-27 MED ORDER — KETOROLAC TROMETHAMINE 60 MG/2ML IM SOLN
60.0000 mg | Freq: Once | INTRAMUSCULAR | Status: AC
  Administered 2019-06-27: 10:00:00 60 mg via INTRAMUSCULAR

## 2019-06-27 MED ORDER — FLUTICASONE PROPIONATE 50 MCG/ACT NA SUSP: 1.0000 | Freq: Two times a day (BID) | NASAL | 0 refills | Status: DC

## 2019-06-27 NOTE — Addendum Note (Signed)
Addended by: Jacqualin Combes on: 06/27/2019 03:01 PM   Modules accepted: Orders

## 2019-06-27 NOTE — Telephone Encounter (Signed)
Noted, will evaluate. 

## 2019-06-27 NOTE — Assessment & Plan Note (Signed)
Acute for the last 2 weeks, improved now.  Differentials include generic dizziness, orthostatic dizziness, dehydration, stress, temporal arteritis, migraine, less likely CVA but will check lipids. Carotid arteries without bruit.   IM Toradol provided today for headache. Start Flonase for ear effusion.  Check labs today including TSH, CBC, CMP, Lipids, Sed rate. Orthostatic vitals negative today.  Given completely normal neuro exam with controlled BP, no visual changes we will hold off on CT head for now.  Discussed strict ED/return precautions.

## 2019-06-27 NOTE — Patient Instructions (Signed)
Stop by the lab prior to leaving today. I will notify you of your results once received.   Try using Flonase (fluticasone) nasal spray. Instill 1 spray in each nostril twice daily.   Go to the hospital if your dizziness returns/progresses, you experience any weakness, blurred vision, changes in speech, you feel like you may pass out.  It was a pleasure to see you today!

## 2019-06-27 NOTE — Progress Notes (Signed)
Subjective:    Patient ID: Bridget Villegas, female    DOB: [birth year], 58 y.o.   MRN: [id number]  HPI  [name] is a 58 year old female with a history of overactive bladder, anxiety and depression, seasonal allergies who presents today with a chief complaint of dizziness.  She called into our office on 06/24/19 with a 1-2 week history of headache with lightheadedness, feeling off balance with leaning to the left when walking. Given these symptoms she was advised to see treatment urgently at Urgent Care.  She called back yesterday and endorses that she was evaluated at [facility] on 06/24/19, provided with benadryl and meclizine. Since then she continues to feel dizzy/disoriented/foggy headed and not feeling "right". Given no improvement she was encouraged to be seen in our office today.  Today she endorses that she had an ECG, glucose test at Urgent Care which was unremarkable. She was told that her symptoms were likely secondary to vertigo. Her headache is located to the left frontal lobe which she describes as a dull ache that is constant. Some nausea and photophobia.   She has been getting frequent headaches over the last 1-2 months, thinks this is stress related as she is working two jobs. She's been taking Ibuprofen for headaches which has helped overall except for the last one week. Her dizziness/lightheadedness has resolved except when making quick movements or bending forward. She has been taking Meclizine and thinks this has helped.   She denies unilateral weakness, changes in speech, blurred vision, vomiting, numbness/tingling.  Review of Systems  Constitutional: Negative for fever.  HENT: Positive for rhinorrhea. Negative for congestion.   Eyes: Negative for visual disturbance.  Neurological: Positive for dizziness and headaches. Negative for weakness and numbness.  Hematological: Negative for adenopathy.       Past Medical History:  Diagnosis Date  . Anxiety  and depression   . Granuloma annulare   . Obesity   . Rheumatic fever      Social History   Socioeconomic History  . Marital status: Legally Separated    Spouse name: Not on file  . Number of children: Not on file  . Years of education: Not on file  . Highest education level: Not on file  Occupational History  . Not on file  Social Needs  . Financial resource strain: Not on file  . Food insecurity    Worry: Not on file    Inability: Not on file  . Transportation needs    Medical: Not on file    Non-medical: Not on file  Tobacco Use  . Smoking status: Never Smoker  . Smokeless tobacco: Never Used  Substance and Sexual Activity  . Alcohol use: No    Alcohol/week: 0.0 standard drinks  . Drug use: No  . Sexual activity: Not Currently    Birth control/protection: Post-menopausal  Lifestyle  . Physical activity    Days per week: 2 days    Minutes per session: 50 min  . Stress: Not on file  Relationships  . Social Herbalist on phone: Not on file    Gets together: Not on file    Attends religious service: Not on file    Active member of club or organization: Not on file    Attends meetings of clubs or organizations: Not on file    Relationship status: Not on file  . Intimate partner violence    Fear of current or ex partner: Not  on file    Emotionally abused: Not on file    Physically abused: Not on file    Forced sexual activity: Not on file  Other Topics Concern  . Not on file  Social History Narrative   Divorced.   Twin girls, 1 son.   Works as a Engineer, materials   Enjoys swimming, walking, spending time with friends.    Past Surgical History:  Procedure Laterality Date  . CESAREAN SECTION    . NASAL SINUS SURGERY      Family History  Problem Relation Age of Onset  . Heart attack Father   . Heart disease Father   . Diabetes Father   . Hypertension Mother   . Alzheimer's disease Mother   . Breast cancer Neg Hx     No Known  Allergies  Current Outpatient Medications on File Prior to Visit  Medication Sig Dispense Refill  . cholecalciferol (VITAMIN D) 1000 units tablet Take 1,000 Units by mouth daily.    . clotrimazole-betamethasone (LOTRISONE) cream Apply externally BID prn sx up to 2 wks 45 g 0  . meclizine (ANTIVERT) 25 MG tablet Take 25 mg by mouth 3 (three) times daily as needed.    Marland Kitchen oxybutynin (DITROPAN-XL) 10 MG 24 hr tablet Take 1 tablet (10 mg total) by mouth daily. 90 tablet 3  . REXULTI 2 MG TABS Take 1 tablet by mouth daily.     . sertraline (ZOLOFT) 100 MG tablet Take 100 mg by mouth daily.      No current facility-administered medications on file prior to visit.     BP 116/74   Pulse 62   Temp 97.8 F (36.6 C) (Temporal)   Ht 5\' 1"  (1.549 m)   Wt 210 lb 12 oz (95.6 kg)   SpO2 100%   BMI 39.82 kg/m    Objective:   Physical Exam  Constitutional: She is oriented to person, place, and time. She appears well-nourished.  Eyes: Pupils are equal, round, and reactive to light. EOM are normal.  Neck: Neck supple. Carotid bruit is not present.  Cardiovascular: Normal rate and regular rhythm.  Respiratory: Effort normal and breath sounds normal.  Neurological: She is alert and oriented to person, place, and time. No cranial nerve deficit. She displays a negative Romberg sign. Coordination normal.  Skin: Skin is warm and dry.  Psychiatric: She has a normal mood and affect.           Assessment & Plan:

## 2020-01-23 ENCOUNTER — Other Ambulatory Visit: Payer: Self-pay | Admitting: Obstetrics and Gynecology

## 2020-01-23 DIAGNOSIS — N3281 Overactive bladder: Secondary | ICD-10-CM

## 2020-01-23 DIAGNOSIS — N3941 Urge incontinence: Secondary | ICD-10-CM

## 2020-02-27 ENCOUNTER — Other Ambulatory Visit: Payer: Self-pay | Admitting: Obstetrics and Gynecology

## 2020-02-27 DIAGNOSIS — N3281 Overactive bladder: Secondary | ICD-10-CM

## 2020-02-27 DIAGNOSIS — N3941 Urge incontinence: Secondary | ICD-10-CM

## 2020-02-28 ENCOUNTER — Other Ambulatory Visit: Payer: Self-pay | Admitting: Obstetrics and Gynecology

## 2020-02-28 DIAGNOSIS — N3941 Urge incontinence: Secondary | ICD-10-CM

## 2020-02-28 DIAGNOSIS — N3281 Overactive bladder: Secondary | ICD-10-CM

## 2020-06-09 ENCOUNTER — Ambulatory Visit: Payer: Managed Care, Other (non HMO) | Admitting: Obstetrics and Gynecology

## 2020-06-25 ENCOUNTER — Ambulatory Visit: Payer: Managed Care, Other (non HMO) | Admitting: Obstetrics and Gynecology

## 2020-06-29 NOTE — Progress Notes (Signed)
Chief Complaint  Patient presents with   Gynecologic Exam    still having the frequency urinating, ran out of Rx     HPI:      [name] is a 59 y.o. [phone] who LMP was No LMP recorded. Patient is postmenopausal., presents today for her annual examination. Her menses are absent due to menopause. She does not have intermenstrual bleeding. She does not have vasomotor sx.   Sex activity: not sexually active. She does not have vaginal dryness.  Last Pap: 11/29/16 Results were: no abnormalities /neg HPV DNA.  Hx of STDs: none  Last mammogram: 06/05/19  Results were: normal--routine follow-up in 12 months There is no FH of breast cancer. There is no FH of ovarian cancer. The patient does do self-breast exams. She gets a red rash under her breasts when it gets hot and she sweats more. Rash is itchy at times but very uncomfortable. Treated with lotrisone crm in past. Better this yr.   Colonoscopy: colonoscopy 6 years ago without abnormalities. Repeat due after 10 yrs.   Tobacco use: The patient denies current or previous tobacco use. Alcohol use: social drinker No drug use Exercise: moderately active  She does get adequate calcium and Vitamin D in her diet.  She has a hx of urinary urgency/OAB sx with urge incont. Tried detrol in past for about a month without relief, had more improvement with ditropan last yr. Rx ran out about 3 months ago, wants to restart. Minimal caffeine use. She occas has SUI sx with cough. No hx of glaucoma.  PT IS SELF PAY   Past Medical History:  Diagnosis Date   Anxiety and depression    Granuloma annulare    Obesity    Rheumatic fever     Past Surgical History:  Procedure Laterality Date   CESAREAN SECTION     COLONOSCOPY  2015   NASAL SINUS SURGERY      Family History  Problem Relation Age of Onset   Heart attack Father    Heart disease Father    Diabetes Father    Hypertension Mother    Alzheimer's disease  Mother    Breast cancer Neg Hx     Social History   Socioeconomic History   Marital status: Legally Separated    Spouse name: Not on file   Number of children: Not on file   Years of education: Not on file   Highest education level: Not on file  Occupational History   Not on file  Tobacco Use   Smoking status: Never Smoker   Smokeless tobacco: Never Used  Vaping Use   Vaping Use: Never used  Substance and Sexual Activity   Alcohol use: No    Alcohol/week: 0.0 standard drinks   Drug use: No   Sexual activity: Not Currently    Birth control/protection: Post-menopausal  Other Topics Concern   Not on file  Social History Narrative   Divorced.   Twin girls, 1 son.   Works as a Engineer, materials   Enjoys swimming, walking, spending time with friends.   Social Determinants of Health   Financial Resource Strain:    Difficulty of Paying Living Expenses: Not on file  Food Insecurity:    Worried About Programme researcher, broadcasting/film/video in the Last Year: Not on file   [employer] of Food in the Last Year: Not on file  Transportation Needs:    Lack of Transportation (Medical): Not on file  Lack of Transportation (Non-Medical): Not on file  Physical Activity:    Days of Exercise per Week: Not on file   Minutes of Exercise per Session: Not on file  Stress:    Feeling of Stress : Not on file  Social Connections:    Frequency of Communication with Friends and Family: Not on file   Frequency of Social Gatherings with Friends and Family: Not on file   Attends Religious Services: Not on file   Active Member of Clubs or Organizations: Not on file   Attends Banker Meetings: Not on file   Marital Status: Not on file  Intimate Partner Violence:    Fear of Current or Ex-Partner: Not on file   Emotionally Abused: Not on file   Physically Abused: Not on file   Sexually Abused: Not on file    Current Outpatient Medications on File Prior to Visit    Medication Sig Dispense Refill   cholecalciferol (VITAMIN D) 1000 units tablet Take 1,000 Units by mouth daily.     sertraline (ZOLOFT) 100 MG tablet Take 100 mg by mouth daily.      fluticasone (FLONASE) 50 MCG/ACT nasal spray Place 1 spray into both nostrils 2 (two) times daily. (Patient not taking: Reported on 06/30/2020) 16 g 0   meclizine (ANTIVERT) 25 MG tablet Take 25 mg by mouth 3 (three) times daily as needed. (Patient not taking: Reported on 06/30/2020)     No current facility-administered medications on file prior to visit.      ROS:  Review of Systems  Constitutional: Negative for fatigue, fever and unexpected weight change.  Respiratory: Negative for cough, shortness of breath and wheezing.   Cardiovascular: Negative for chest pain, palpitations and leg swelling.  Gastrointestinal: Negative for blood in stool, constipation, diarrhea, nausea and vomiting.  Endocrine: Negative for cold intolerance, heat intolerance and polyuria.  Genitourinary: Positive for urgency. Negative for dyspareunia, dysuria, flank pain, frequency, genital sores, hematuria, menstrual problem, pelvic pain, vaginal bleeding, vaginal discharge and vaginal pain.  Musculoskeletal: Negative for back pain, joint swelling and myalgias.  Skin: Positive for rash.  Neurological: Negative for dizziness, syncope, light-headedness, numbness and headaches.  Hematological: Negative for adenopathy.  Psychiatric/Behavioral: Negative for agitation, confusion, dysphoric mood, sleep disturbance and suicidal ideas. The patient is not nervous/anxious.      Objective: BP 100/70    Ht 5' 2.5" (1.588 m)    Wt 217 lb (98.4 kg)    BMI 39.06 kg/m    Physical Exam Constitutional:      Appearance: She is well-developed.  Genitourinary:     Vulva, vagina, cervix, uterus, right adnexa and left adnexa normal.     No vulval lesion or tenderness noted.     No vaginal discharge, erythema or tenderness.     No cervical  polyp.     Uterus is not enlarged or tender.     No right or left adnexal mass present.     Right adnexa not tender.     Left adnexa not tender.  Neck:     Thyroid: No thyromegaly.  Cardiovascular:     Rate and Rhythm: Normal rate and regular rhythm.     Heart sounds: Normal heart sounds. No murmur heard.   Pulmonary:     Effort: Pulmonary effort is normal.     Breath sounds: Normal breath sounds.  Chest:     Breasts:        Right: No mass, nipple discharge, skin change or tenderness.  Left: No mass, nipple discharge, skin change or tenderness.  Abdominal:     Palpations: Abdomen is soft.     Tenderness: There is no abdominal tenderness. There is no guarding.  Musculoskeletal:        General: Normal range of motion.     Cervical back: Normal range of motion.  Neurological:     General: No focal deficit present.     Mental Status: She is alert and oriented to person, place, and time.     Cranial Nerves: No cranial nerve deficit.  Skin:    General: Skin is warm and dry.  Psychiatric:        Mood and Affect: Mood normal.        Behavior: Behavior normal.        Thought Content: Thought content normal.        Judgment: Judgment normal.  Vitals reviewed.      Assessment/Plan: Encounter for annual routine gynecological examination  Encounter for screening mammogram for malignant neoplasm of breast - Plan: MM 3D SCREEN BREAST BILATERAL; pt to sched mammo. Try [facility].  OAB (overactive bladder)--sx improved with ditropan.   Urge incontinence - Plan: oxybutynin (DITROPAN-XL) 10 MG 24 hr tablet; Rx RF. F/u prn.   Meds ordered this encounter  Medications   oxybutynin (DITROPAN-XL) 10 MG 24 hr tablet    Sig: Take 1 tablet (10 mg total) by mouth daily.    Dispense:  90 tablet    Refill:  3    Order Specific Question:   Supervising Provider    Answer:   Nadara Mustard [[id number]]             GYN counsel breast self exam, mammography screening, menopause,  adequate intake of calcium and vitamin D, diet and exercise   PT IS SELF PAY    F/U  Return in about 1 year (around 06/30/2021).  Clois Montavon B. Asaph Serena, PA-C 06/30/2020 10:08 AM

## 2020-06-30 ENCOUNTER — Encounter: Payer: Self-pay | Admitting: Obstetrics and Gynecology

## 2020-06-30 ENCOUNTER — Other Ambulatory Visit: Payer: Self-pay

## 2020-06-30 ENCOUNTER — Ambulatory Visit (INDEPENDENT_AMBULATORY_CARE_PROVIDER_SITE_OTHER): Payer: Self-pay | Admitting: Obstetrics and Gynecology

## 2020-06-30 VITALS — BP 100/70 | Ht 62.5 in | Wt 217.0 lb

## 2020-06-30 DIAGNOSIS — Z01419 Encounter for gynecological examination (general) (routine) without abnormal findings: Secondary | ICD-10-CM

## 2020-06-30 DIAGNOSIS — N3941 Urge incontinence: Secondary | ICD-10-CM

## 2020-06-30 DIAGNOSIS — Z1231 Encounter for screening mammogram for malignant neoplasm of breast: Secondary | ICD-10-CM

## 2020-06-30 DIAGNOSIS — N3281 Overactive bladder: Secondary | ICD-10-CM

## 2020-06-30 MED ORDER — OXYBUTYNIN CHLORIDE ER 10 MG PO TB24: 10.0000 mg | ORAL_TABLET | Freq: Every day | ORAL | 3 refills | Status: DC

## 2020-06-30 NOTE — Patient Instructions (Addendum)
I value your feedback and entrusting us with your care. If you get a [facility] patient survey, I would appreciate you taking the time to let us know about your experience today. Thank you!  As of August 21, 2019, your lab results will be released to your MyChart immediately, before I even have a chance to see them. Please give me time to review them and contact you if there are any abnormalities. Thank you for your patience.   [facility]: [phone]   *[facility]  

## 2020-07-28 ENCOUNTER — Ambulatory Visit: Payer: Self-pay | Attending: Oncology | Admitting: *Deleted

## 2020-07-28 ENCOUNTER — Other Ambulatory Visit: Payer: Self-pay

## 2020-07-28 ENCOUNTER — Encounter: Payer: Self-pay | Admitting: *Deleted

## 2020-07-28 ENCOUNTER — Ambulatory Visit
Admission: RE | Admit: 2020-07-28 | Discharge: 2020-07-28 | Disposition: A | Discharge status: Home / Self Care | Payer: Self-pay | Source: Ambulatory Visit | Attending: Oncology | Admitting: Oncology

## 2020-07-28 VITALS — BP 97/63 | HR 56 | Temp 98.9°F | Ht 62.0 in | Wt 210.0 lb

## 2020-07-28 DIAGNOSIS — Z0000 Encounter for general adult medical examination without abnormal findings: Secondary | ICD-10-CM | POA: Insufficient documentation

## 2020-07-28 NOTE — Progress Notes (Addendum)
°  Subjective:     Patient ID: Bridget Villegas, female   DOB: 1961-06-22, 59 y.o.   MRN: [id number]  HPI   [facility] Medical History Record - 07/28/20 3151      Breast History   Screening cycle New    Provider ([facility]) [facility]    Initial Mammogram 07/28/20    Last Mammogram Annual    Last Mammogram Date 07/28/20    Provider (Mammogram)  [facility]    Recent Breast Symptoms None      Breast Cancer History   Breast Cancer History No personal or family history      Previous History of Breast Problems   Breast Surgery or Biopsy None    Breast Implants Left    BSE Done --   sometimes     Gynecological/Obstetrical History   LMP --   38   Is there any chance that the client could be pregnant?  No    Age at menarche 32    Age at menopause 74    PAP smear history Annually    Date of last PAP  11/29/16    Provider (PAP) [facility]  neg/neg    Age at first live birth 16    Breast fed children No    DES Exposure No    Cervical, Uterine or Ovarian cancer No    Family history of Cervial, Uterine or Ovarian cancer No    Hysterectomy No    Cervix removed No    Ovaries removed No    Laser/Cryosurgery No    Current method of birth control None    Current method of Estrogen/Hormone replacement None    Smoking history None             Review of Systems     Objective:   Physical Exam Chest:     Breasts:        Right: No swelling, bleeding, inverted nipple, mass, nipple discharge, skin change or tenderness.        Left: No swelling, bleeding, inverted nipple, mass, nipple discharge or skin change.  Lymphadenopathy:     Upper Body:     Right upper body: No supraclavicular or axillary adenopathy.     Left upper body: No supraclavicular or axillary adenopathy.        Assessment:     59 year old female returns to [facility] screening.  Clinical breast exam unremarkable. Taught self breast awareness.  Last pap on 11/29/16 was negative / negative.  Next pap due in 2023.  Patient  has been screened for eligibility.  She does not have any insurance, Medicare or Medicaid.  She also meets financial eligibility. Tyrer-Cuzick breast cancer risk assessment with a lifetime risk of 14.8%.  Per NCCN guidelines no modified imaging or genetic testing is recommended. Risk Assessment    Risk Scores      07/28/2020   Last edited by: Scarlett Presto, RN   5-year risk: 1.8 %   Lifetime risk: 8.6 %             Plan:     Screening mammogram ordered.  Will follow up per [facility] protocol.

## 2020-07-28 NOTE — Patient Instructions (Signed)
Gave patient hand-out, Women Staying Healthy, Active and Well from [facility], with education on breast health, pap smears, heart and colon health. 

## 2020-08-10 ENCOUNTER — Encounter: Payer: Self-pay | Admitting: *Deleted

## 2020-08-10 NOTE — Progress Notes (Signed)
Letter mailed from [facility] to inform patient of her normal mammogram results.  Patient is to follow-up with annual screening in one year. 

## 2020-08-17 ENCOUNTER — Encounter: Payer: Self-pay | Admitting: *Deleted

## 2021-02-17 ENCOUNTER — Ambulatory Visit
Admission: RE | Admit: 2021-02-17 | Discharge: 2021-02-17 | Disposition: A | Discharge status: Home / Self Care | Payer: 59 | Source: Ambulatory Visit | Attending: Family Medicine | Admitting: Family Medicine

## 2021-02-17 ENCOUNTER — Other Ambulatory Visit: Payer: Self-pay

## 2021-02-17 ENCOUNTER — Other Ambulatory Visit: Payer: Self-pay | Admitting: Family Medicine

## 2021-02-17 DIAGNOSIS — M7989 Other specified soft tissue disorders: Secondary | ICD-10-CM | POA: Diagnosis present

## 2021-02-17 DIAGNOSIS — M79605 Pain in left leg: Secondary | ICD-10-CM

## 2021-03-11 IMAGING — MG MM DIGITAL SCREENING BILAT W/ TOMO W/ CAD
6 of 10 series · 6 of 30 positions shown · non-contrast
Comparison: Previous exam(s).

CLINICAL DATA: Screening.

EXAM:
DIGITAL SCREENING BILATERAL MAMMOGRAM WITH TOMO AND CAD

[L MLO synth-2D (1 of 2)]
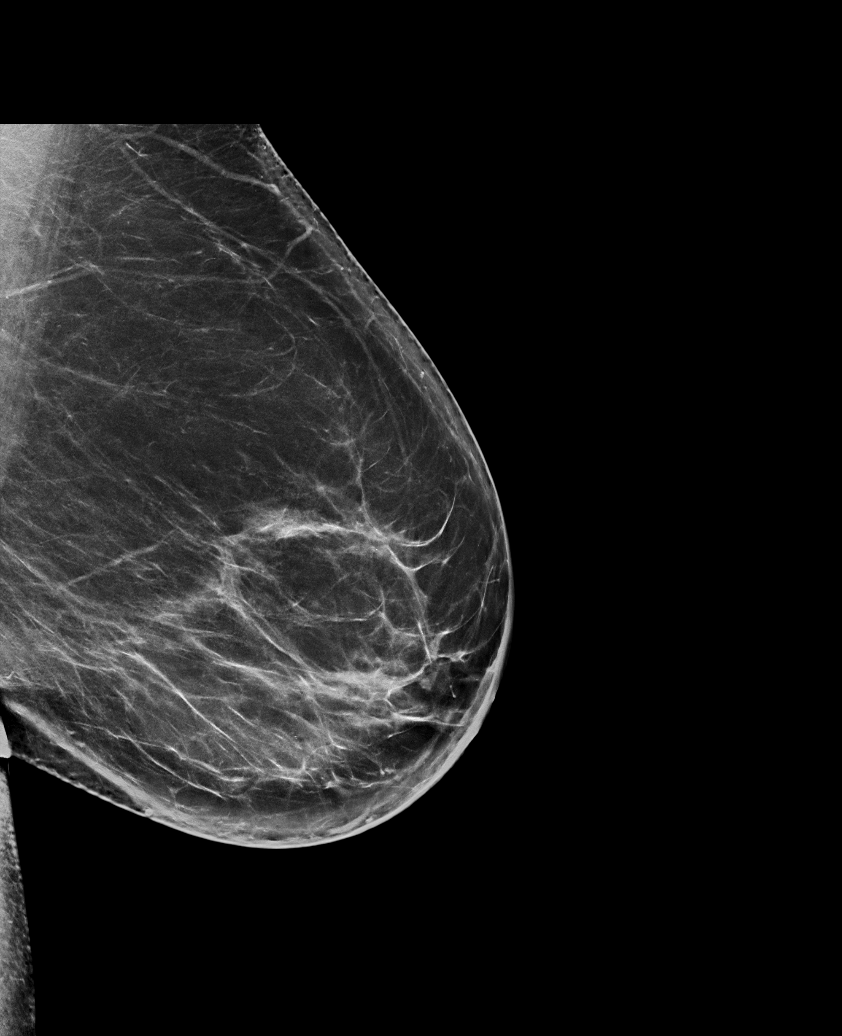

[L CC synth-2D]
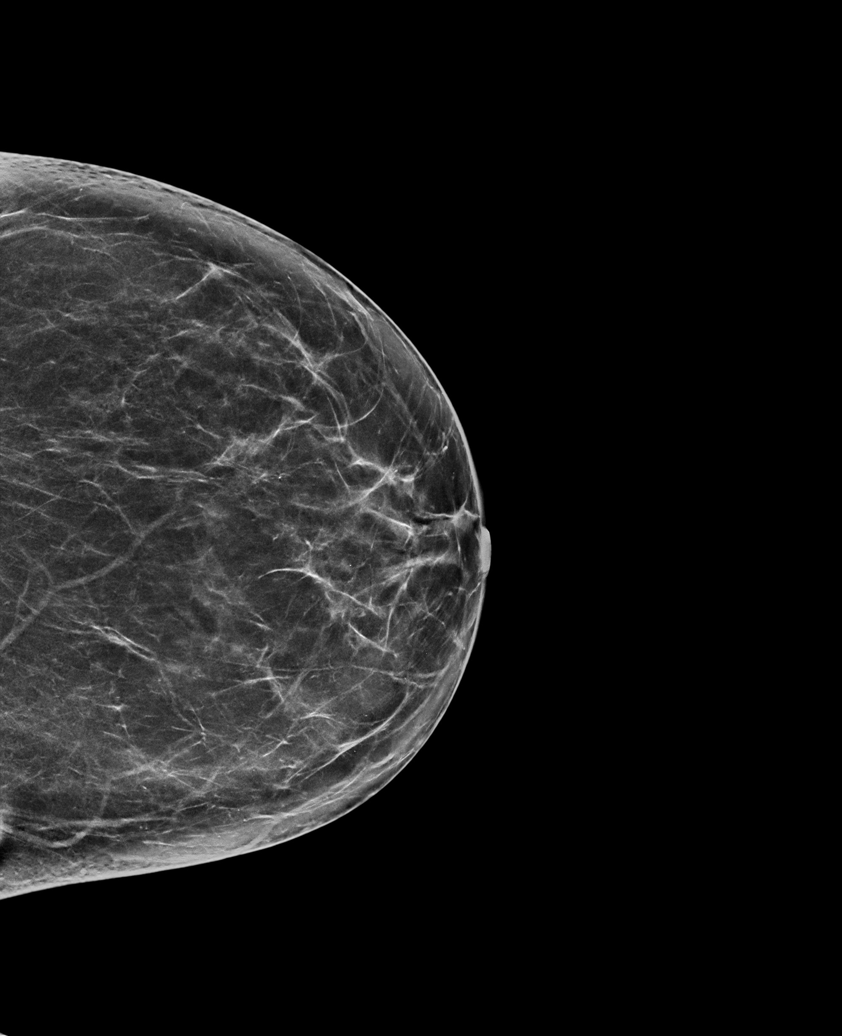

[L MLO synth-2D (2 of 2)]
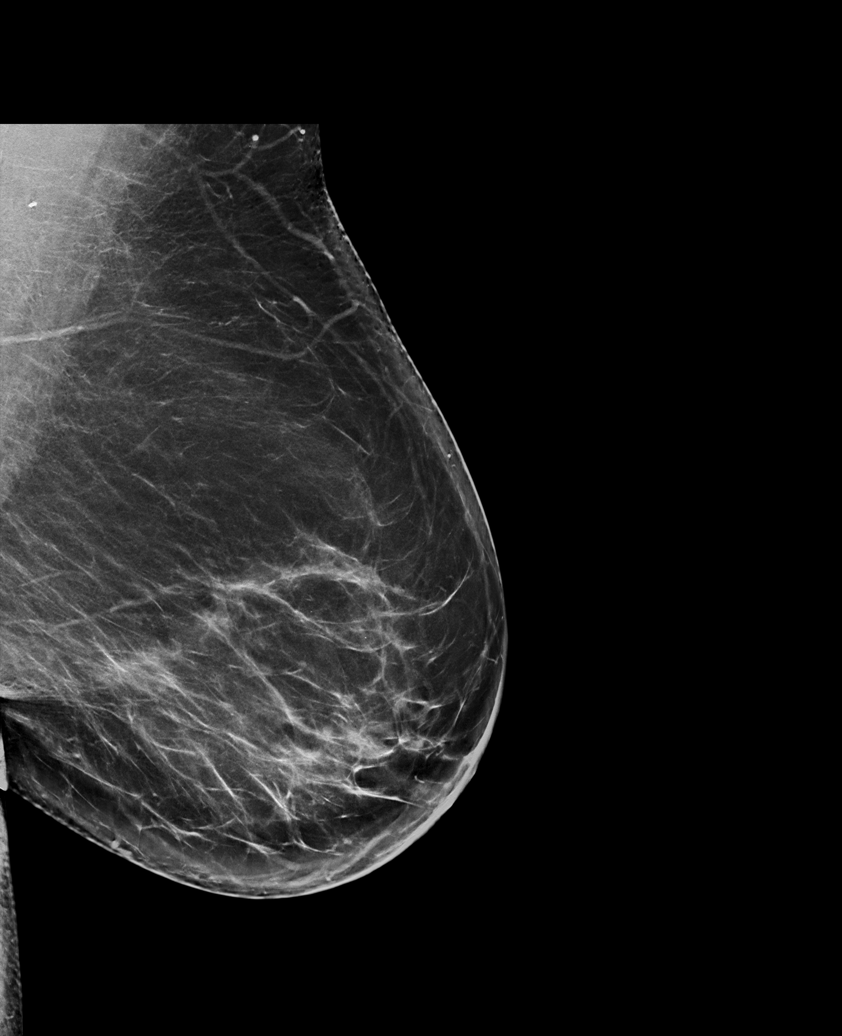

[R CC synth-2D]
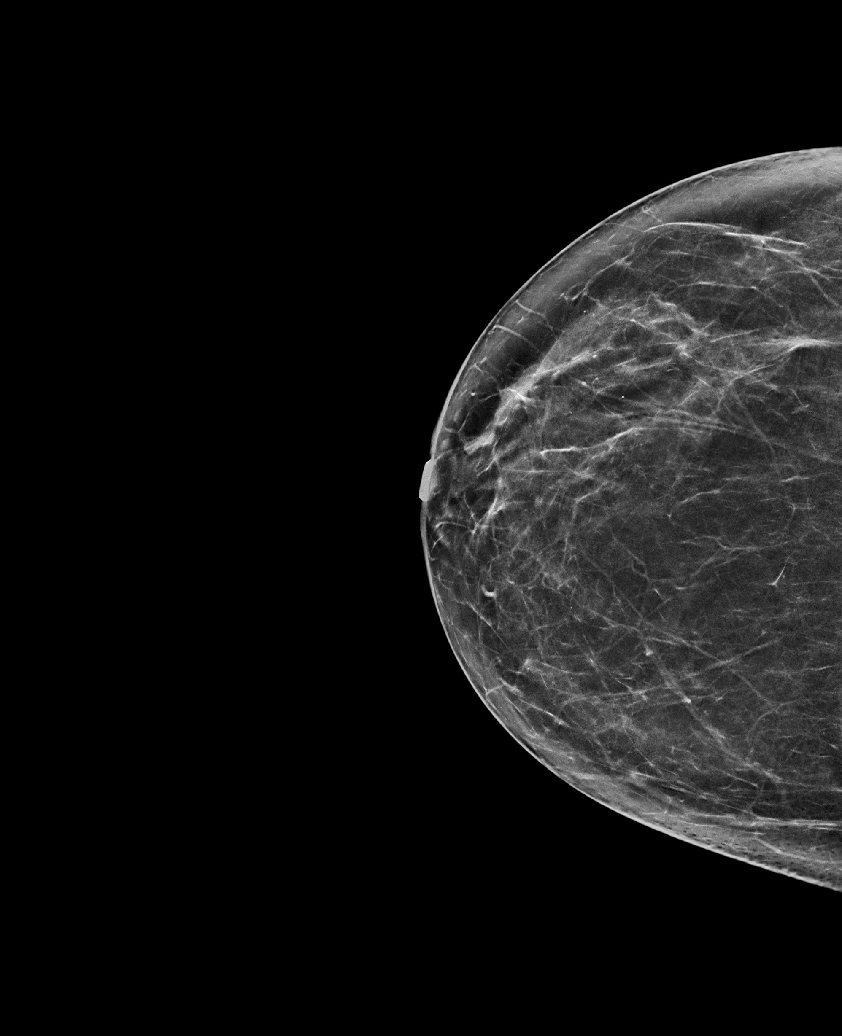

[R MLO synth-2D]
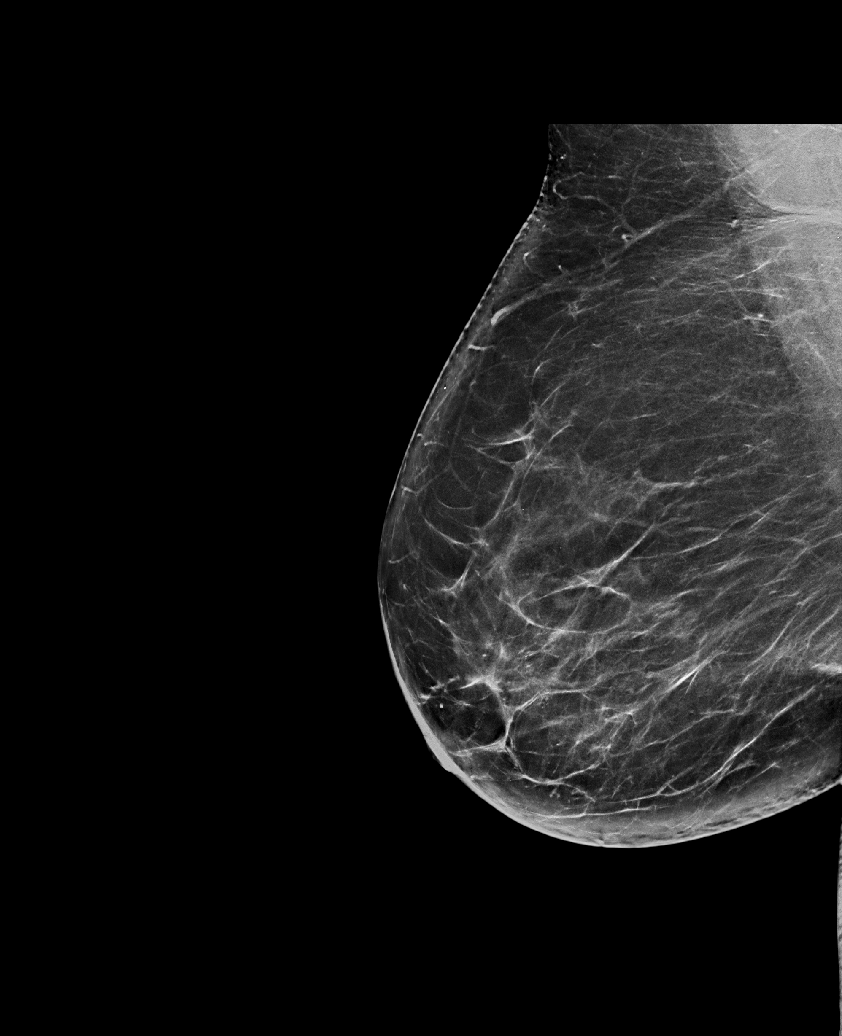

[L MLO tomo · tomo slice 45/89.0]
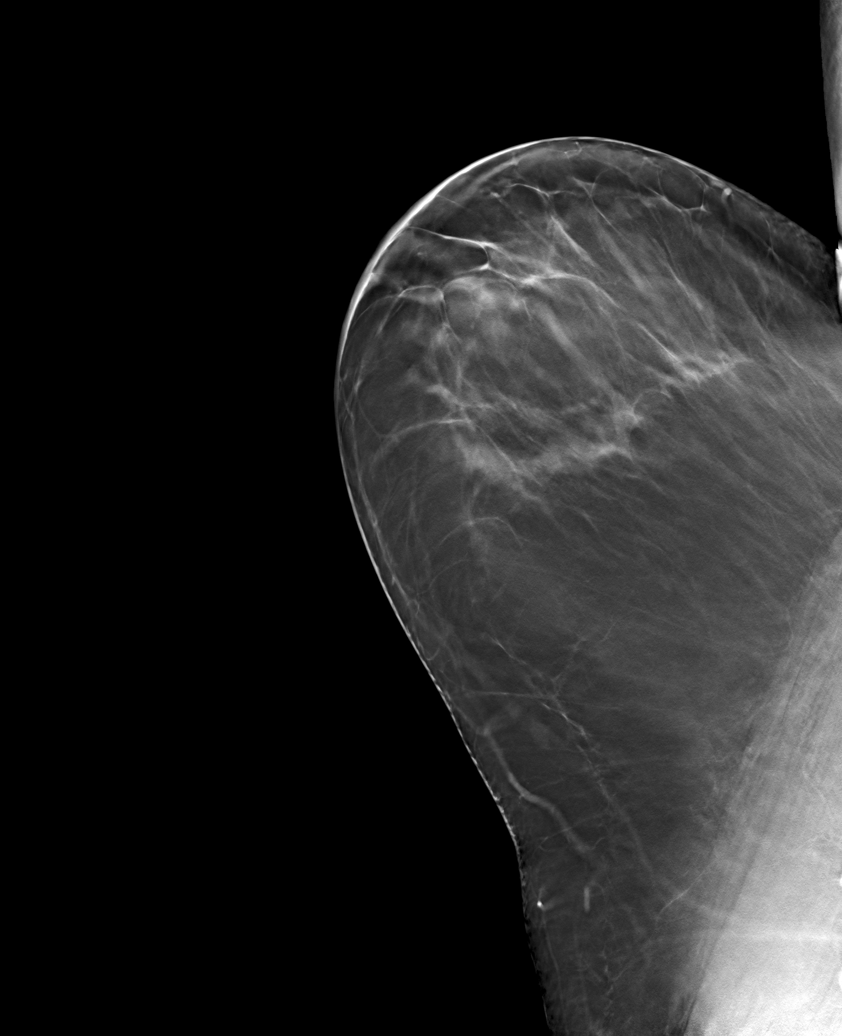

[6 of 30 positions shown; findings below may reference images not displayed]

ACR Breast Density Category b: There are scattered areas of
fibroglandular density.
FINDINGS: There are no findings suspicious for malignancy. Images were
processed with CAD.
IMPRESSION: No mammographic evidence of malignancy. A result letter of this
screening mammogram will be mailed directly to the patient.

RECOMMENDATION:
Screening mammogram in one year. (Code:[id number]-775)

BI-RADS CATEGORY  1: Negative.

## 2021-03-31 ENCOUNTER — Other Ambulatory Visit: Payer: Self-pay | Admitting: Obstetrics and Gynecology

## 2021-03-31 DIAGNOSIS — N3941 Urge incontinence: Secondary | ICD-10-CM

## 2021-06-28 ENCOUNTER — Other Ambulatory Visit: Payer: Self-pay | Admitting: Obstetrics and Gynecology

## 2021-06-28 DIAGNOSIS — N3941 Urge incontinence: Secondary | ICD-10-CM

## 2021-07-07 ENCOUNTER — Ambulatory Visit: Payer: Self-pay | Admitting: Obstetrics and Gynecology

## 2021-07-13 NOTE — Progress Notes (Signed)
Chief Complaint  Patient presents with   Gynecologic Exam    Urinary incontinence     HPI:      [name] is a 60 y.o. [phone] who LMP was No LMP recorded. Patient is postmenopausal., presents today for her annual examination. Her menses are absent due to menopause. She does not have PMB. She does not have vasomotor sx.    Sex activity: not sexually active. She does not have vaginal dryness.   Last Pap: 11/29/16 Results were: no abnormalities /neg HPV DNA.  Hx of STDs: none   Last mammogram: 07/28/20  Results were: normal--routine follow-up in 12 months There is no FH of breast cancer. There is no FH of ovarian cancer. The patient does do self-breast exams.    Colonoscopy: colonoscopy 7 years ago without abnormalities. Repeat due after 10 yrs.    Tobacco use: The patient denies current or previous tobacco use. Alcohol use: social drinker No drug use Exercise: min active; plans to start exercising and eating healthier again. Has been working OT and stressed, very tired. Has gained wt back.    She does get adequate calcium and Vitamin D in her diet.   She has a hx of urinary urgency/OAB sx with urge incont. Doing ditropan XL 10 mg with sx improvement in past but sx worsening again. Drinking some caffeine daily. Has also gained wt.  She occas has SUI sx with cough. No hx of glaucoma.   Past Medical History:  Diagnosis Date   Anxiety and depression    Granuloma annulare    Mixed incontinence urge and stress    OAB (overactive bladder)    Obesity    Rheumatic fever     Past Surgical History:  Procedure Laterality Date   CESAREAN SECTION     COLONOSCOPY  2015   NASAL SINUS SURGERY      Family History  Problem Relation Age of Onset   Heart attack Father    Heart disease Father    Diabetes Father    Hypertension Mother    Alzheimer's disease Mother    Breast cancer Neg Hx     Social History   Socioeconomic History   Marital status: Legally Separated     Spouse name: Not on file   Number of children: Not on file   Years of education: Not on file   Highest education level: Not on file  Occupational History   Not on file  Tobacco Use   Smoking status: Never   Smokeless tobacco: Never  Vaping Use   Vaping Use: Never used  Substance and Sexual Activity   Alcohol use: No    Alcohol/week: 0.0 standard drinks   Drug use: No   Sexual activity: Not Currently    Birth control/protection: Post-menopausal  Other Topics Concern   Not on file  Social History Narrative   Divorced.   Twin girls, 1 son.   Works as a Insurance claims handler   Enjoys swimming, walking, spending time with friends.   Social Determinants of Health   Financial Resource Strain: Not on file  Food Insecurity: Not on file  Transportation Needs: Not on file  Physical Activity: Not on file  Stress: Not on file  Social Connections: Not on file  Intimate Partner Violence: Not on file    Current Outpatient Medications on File Prior to Visit  Medication Sig Dispense Refill   ARIPiprazole (ABILIFY) 5 MG tablet Take by mouth.     cholecalciferol (  VITAMIN D) 1000 units tablet Take 1,000 Units by mouth daily.     sertraline (ZOLOFT) 100 MG tablet Take 100 mg by mouth daily.      No current facility-administered medications on file prior to visit.      ROS:  Review of Systems  Constitutional:  Negative for fatigue, fever and unexpected weight change.  Respiratory:  Negative for cough, shortness of breath and wheezing.   Cardiovascular:  Negative for chest pain, palpitations and leg swelling.  Gastrointestinal:  Negative for blood in stool, constipation, diarrhea, nausea and vomiting.  Endocrine: Negative for cold intolerance, heat intolerance and polyuria.  Genitourinary:  Positive for urgency. Negative for dyspareunia, dysuria, flank pain, frequency, genital sores, hematuria, menstrual problem, pelvic pain, vaginal bleeding, vaginal discharge and vaginal pain.   Musculoskeletal:  Negative for back pain, joint swelling and myalgias.  Skin:  Positive for rash.  Neurological:  Negative for dizziness, syncope, light-headedness, numbness and headaches.  Hematological:  Negative for adenopathy.  Psychiatric/Behavioral:  Negative for agitation, confusion, dysphoric mood, sleep disturbance and suicidal ideas. The patient is not nervous/anxious.     Objective: BP 100/60   Ht 5' 2.5" (1.588 m)   Wt 223 lb (101.2 kg)   BMI 40.14 kg/m    Physical Exam Constitutional:      Appearance: She is well-developed.  Genitourinary:     Vulva normal.     Right Labia: No rash, tenderness or lesions.    Left Labia: No tenderness, lesions or rash.    No vaginal discharge, erythema or tenderness.      Right Adnexa: not tender and no mass present.    Left Adnexa: not tender and no mass present.    No cervical friability or polyp.     Uterus is not enlarged or tender.  Breasts:    Right: No mass, nipple discharge, skin change or tenderness.     Left: No mass, nipple discharge, skin change or tenderness.  Neck:     Thyroid: No thyromegaly.  Cardiovascular:     Rate and Rhythm: Normal rate and regular rhythm.     Heart sounds: Normal heart sounds. No murmur heard. Pulmonary:     Effort: Pulmonary effort is normal.     Breath sounds: Normal breath sounds.  Abdominal:     Palpations: Abdomen is soft.     Tenderness: There is no abdominal tenderness. There is no guarding or rebound.  Musculoskeletal:        General: Normal range of motion.     Cervical back: Normal range of motion.  Lymphadenopathy:     Cervical: No cervical adenopathy.  Neurological:     General: No focal deficit present.     Mental Status: She is alert and oriented to person, place, and time.     Cranial Nerves: No cranial nerve deficit.  Skin:    General: Skin is warm and dry.  Psychiatric:        Mood and Affect: Mood normal.        Behavior: Behavior normal.        Thought  Content: Thought content normal.        Judgment: Judgment normal.  Vitals reviewed.     Assessment/Plan: Encounter for annual routine gynecological examination  Cervical cancer screening - Plan: Cytology - PAP  Screening for HPV (human papillomavirus) - Plan: Cytology - PAP  Encounter for screening mammogram for malignant neoplasm of breast - Plan: MM 3D SCREEN BREAST BILATERAL; pt to sched mammo  OAB (overactive bladder) - Plan: oxybutynin (DITROPAN XL) 15 MG 24 hr tablet; increase ditropan, d/c caffeine. Pt to f/u in a month or so with sx.   Urge incontinence - Plan: oxybutynin (DITROPAN XL) 15 MG 24 hr tablet   Meds ordered this encounter  Medications   oxybutynin (DITROPAN XL) 15 MG 24 hr tablet    Sig: Take 1 tablet (15 mg total) by mouth daily.    Dispense:  90 tablet    Refill:  0    Order Specific Question:   Supervising Provider    Answer:   Nadara Mustard [[id number]]              GYN counsel breast self exam, mammography screening, menopause, adequate intake of calcium and vitamin D, diet and exercise     F/U  Return in about 1 year (around 07/14/2022).  Gracie Gupta B. Khadija Thier, PA-C 07/14/2021 9:28 AM

## 2021-07-14 ENCOUNTER — Encounter: Payer: Self-pay | Admitting: Obstetrics and Gynecology

## 2021-07-14 ENCOUNTER — Other Ambulatory Visit: Payer: Self-pay

## 2021-07-14 ENCOUNTER — Other Ambulatory Visit (HOSPITAL_COMMUNITY)
Admission: RE | Admit: 2021-07-14 | Discharge: 2021-07-14 | Disposition: A | Discharge status: Home / Self Care | Payer: 59 | Source: Ambulatory Visit | Attending: Obstetrics and Gynecology | Admitting: Obstetrics and Gynecology

## 2021-07-14 ENCOUNTER — Ambulatory Visit (INDEPENDENT_AMBULATORY_CARE_PROVIDER_SITE_OTHER): Payer: 59 | Admitting: Obstetrics and Gynecology

## 2021-07-14 VITALS — BP 100/60 | Ht 62.5 in | Wt 223.0 lb

## 2021-07-14 DIAGNOSIS — Z01419 Encounter for gynecological examination (general) (routine) without abnormal findings: Secondary | ICD-10-CM

## 2021-07-14 DIAGNOSIS — Z124 Encounter for screening for malignant neoplasm of cervix: Secondary | ICD-10-CM

## 2021-07-14 DIAGNOSIS — Z1151 Encounter for screening for human papillomavirus (HPV): Secondary | ICD-10-CM

## 2021-07-14 DIAGNOSIS — Z1231 Encounter for screening mammogram for malignant neoplasm of breast: Secondary | ICD-10-CM

## 2021-07-14 DIAGNOSIS — N3281 Overactive bladder: Secondary | ICD-10-CM

## 2021-07-14 DIAGNOSIS — N3941 Urge incontinence: Secondary | ICD-10-CM

## 2021-07-14 MED ORDER — OXYBUTYNIN CHLORIDE ER 15 MG PO TB24: 15.0000 mg | ORAL_TABLET | Freq: Every day | ORAL | 0 refills | Status: DC

## 2021-07-14 NOTE — Patient Instructions (Signed)
I value your feedback and you entrusting us with your care. If you get a [facility] patient survey, I would appreciate you taking the time to let us know about your experience today. Thank you!  [facility]: [phone]      

## 2021-07-18 LAB — CYTOLOGY - PAP
Adequacy: ABSENT
Comment: NEGATIVE
Diagnosis: NEGATIVE
High risk HPV: NEGATIVE

## 2021-08-11 ENCOUNTER — Other Ambulatory Visit: Payer: Self-pay

## 2021-08-11 ENCOUNTER — Encounter: Payer: Self-pay | Admitting: Obstetrics and Gynecology

## 2021-08-11 ENCOUNTER — Ambulatory Visit
Admission: RE | Admit: 2021-08-11 | Discharge: 2021-08-11 | Disposition: A | Discharge status: Home / Self Care | Payer: 59 | Source: Ambulatory Visit | Attending: Obstetrics and Gynecology | Admitting: Obstetrics and Gynecology

## 2021-08-11 DIAGNOSIS — Z1231 Encounter for screening mammogram for malignant neoplasm of breast: Secondary | ICD-10-CM | POA: Insufficient documentation

## 2021-10-17 ENCOUNTER — Other Ambulatory Visit: Payer: Self-pay | Admitting: Obstetrics and Gynecology

## 2021-10-17 DIAGNOSIS — N3941 Urge incontinence: Secondary | ICD-10-CM

## 2021-10-17 DIAGNOSIS — N3281 Overactive bladder: Secondary | ICD-10-CM

## 2021-10-18 NOTE — Telephone Encounter (Signed)
Called pt, no answer, [facility]. 

## 2021-10-18 NOTE — Telephone Encounter (Signed)
Pls contact pt to see how Rx is working. Was supposed to let me know after a month. Thx.

## 2022-07-12 DIAGNOSIS — R69 Illness, unspecified: Secondary | ICD-10-CM | POA: Diagnosis not present

## 2022-07-12 DIAGNOSIS — F411 Generalized anxiety disorder: Secondary | ICD-10-CM | POA: Diagnosis not present

## 2022-07-16 NOTE — Progress Notes (Unsigned)
No chief complaint on file.    HPI:      [name] is a 61 y.o. [phone] who LMP was No LMP recorded. Patient is postmenopausal., presents today for her annual examination. Her menses are absent due to menopause. She does not have PMB. She does not have vasomotor sx.    Sex activity: not sexually active. She does not have vaginal dryness.   Last Pap: 07/14/21  Results were: no abnormalities /neg HPV DNA.  Hx of STDs: none   Last mammogram: 08/11/21  Results were: normal--routine follow-up in 12 months There is no FH of breast cancer. There is no FH of ovarian cancer. The patient does do self-breast exams.    Colonoscopy: colonoscopy 8 years ago without abnormalities. Repeat due after 10 yrs.    Tobacco use: The patient denies current or previous tobacco use. Alcohol use: social drinker No drug use Exercise: min active; plans to start exercising and eating healthier again. Has been working OT and stressed, very tired. Has gained wt back.    She does get adequate calcium and Vitamin D in her diet.   She has a hx of urinary urgency/OAB sx with urge incont. Doing ditropan XL 10 mg with sx improvement in past but sx worsening again. Drinking some caffeine daily. Has also gained wt.  She occas has SUI sx with cough. No hx of glaucoma.   Past Medical History:  Diagnosis Date   Anxiety and depression    Granuloma annulare    Mixed incontinence urge and stress    OAB (overactive bladder)    Obesity    Rheumatic fever     Past Surgical History:  Procedure Laterality Date   CESAREAN SECTION     COLONOSCOPY  2015   NASAL SINUS SURGERY      Family History  Problem Relation Age of Onset   Heart attack Father    Heart disease Father    Diabetes Father    Hypertension Mother    Alzheimer's disease Mother    Breast cancer Neg Hx     Social History   Socioeconomic History   Marital status: Legally Separated    Spouse name: Not on file   Number of children: Not  on file   Years of education: Not on file   Highest education level: Not on file  Occupational History   Not on file  Tobacco Use   Smoking status: Never   Smokeless tobacco: Never  Vaping Use   Vaping Use: Never used  Substance and Sexual Activity   Alcohol use: No    Alcohol/week: 0.0 standard drinks of alcohol   Drug use: No   Sexual activity: Not Currently    Birth control/protection: Post-menopausal  Other Topics Concern   Not on file  Social History Narrative   Divorced.   Twin girls, 1 son.   Works as a Engineer, materials   Enjoys swimming, walking, spending time with friends.   Social Determinants of Health   Financial Resource Strain: Not on file  Food Insecurity: Not on file  Transportation Needs: Not on file  Physical Activity: Insufficiently Active (02/19/2019)   Exercise Vital Sign    Days of Exercise per Week: 2 days    Minutes of Exercise per Session: 50 min  Stress: Not on file  Social Connections: Not on file  Intimate Partner Violence: Not on file    Current Outpatient Medications on File Prior to Visit  Medication Sig  Dispense Refill   ARIPiprazole (ABILIFY) 5 MG tablet Take by mouth.     cholecalciferol (VITAMIN D) 1000 units tablet Take 1,000 Units by mouth daily.     oxybutynin (DITROPAN XL) 15 MG 24 hr tablet TAKE 1 TABLET BY MOUTH ONCE DAILY 90 tablet 2   sertraline (ZOLOFT) 100 MG tablet Take 100 mg by mouth daily.      No current facility-administered medications on file prior to visit.      ROS:  Review of Systems  Constitutional:  Negative for fatigue, fever and unexpected weight change.  Respiratory:  Negative for cough, shortness of breath and wheezing.   Cardiovascular:  Negative for chest pain, palpitations and leg swelling.  Gastrointestinal:  Negative for blood in stool, constipation, diarrhea, nausea and vomiting.  Endocrine: Negative for cold intolerance, heat intolerance and polyuria.  Genitourinary:  Positive for  urgency. Negative for dyspareunia, dysuria, flank pain, frequency, genital sores, hematuria, menstrual problem, pelvic pain, vaginal bleeding, vaginal discharge and vaginal pain.  Musculoskeletal:  Negative for back pain, joint swelling and myalgias.  Skin:  Positive for rash.  Neurological:  Negative for dizziness, syncope, light-headedness, numbness and headaches.  Hematological:  Negative for adenopathy.  Psychiatric/Behavioral:  Negative for agitation, confusion, dysphoric mood, sleep disturbance and suicidal ideas. The patient is not nervous/anxious.      Objective: There were no vitals taken for this visit.   Physical Exam Constitutional:      Appearance: She is well-developed.  Genitourinary:     Vulva normal.     Right Labia: No rash, tenderness or lesions.    Left Labia: No tenderness, lesions or rash.    No vaginal discharge, erythema or tenderness.      Right Adnexa: not tender and no mass present.    Left Adnexa: not tender and no mass present.    No cervical friability or polyp.     Uterus is not enlarged or tender.  Breasts:    Right: No mass, nipple discharge, skin change or tenderness.     Left: No mass, nipple discharge, skin change or tenderness.  Neck:     Thyroid: No thyromegaly.  Cardiovascular:     Rate and Rhythm: Normal rate and regular rhythm.     Heart sounds: Normal heart sounds. No murmur heard. Pulmonary:     Effort: Pulmonary effort is normal.     Breath sounds: Normal breath sounds.  Abdominal:     Palpations: Abdomen is soft.     Tenderness: There is no abdominal tenderness. There is no guarding or rebound.  Musculoskeletal:        General: Normal range of motion.     Cervical back: Normal range of motion.  Lymphadenopathy:     Cervical: No cervical adenopathy.  Neurological:     General: No focal deficit present.     Mental Status: She is alert and oriented to person, place, and time.     Cranial Nerves: No cranial nerve deficit.   Skin:    General: Skin is warm and dry.  Psychiatric:        Mood and Affect: Mood normal.        Behavior: Behavior normal.        Thought Content: Thought content normal.        Judgment: Judgment normal.  Vitals reviewed.      Assessment/Plan: Encounter for annual routine gynecological examination  Cervical cancer screening - Plan: Cytology - PAP  Screening for HPV (human papillomavirus) -  Plan: Cytology - PAP  Encounter for screening mammogram for malignant neoplasm of breast - Plan: MM 3D SCREEN BREAST BILATERAL; pt to sched mammo  OAB (overactive bladder) - Plan: oxybutynin (DITROPAN XL) 15 MG 24 hr tablet; increase ditropan, d/c caffeine. Pt to f/u in a month or so with sx.   Urge incontinence - Plan: oxybutynin (DITROPAN XL) 15 MG 24 hr tablet   No orders of the defined types were placed in this encounter.             GYN counsel breast self exam, mammography screening, menopause, adequate intake of calcium and vitamin D, diet and exercise     F/U  No follow-ups on file.  Cecily Lawhorne B. Fionn Stracke, PA-C 07/16/2022 9:09 PM

## 2022-07-18 ENCOUNTER — Encounter: Payer: Self-pay | Admitting: Obstetrics and Gynecology

## 2022-07-18 ENCOUNTER — Ambulatory Visit (INDEPENDENT_AMBULATORY_CARE_PROVIDER_SITE_OTHER): Payer: 59 | Admitting: Obstetrics and Gynecology

## 2022-07-18 VITALS — BP 119/63 | HR 64 | Ht 62.0 in | Wt 219.7 lb

## 2022-07-18 DIAGNOSIS — N3281 Overactive bladder: Secondary | ICD-10-CM | POA: Diagnosis not present

## 2022-07-18 DIAGNOSIS — N3941 Urge incontinence: Secondary | ICD-10-CM

## 2022-07-18 DIAGNOSIS — Z01419 Encounter for gynecological examination (general) (routine) without abnormal findings: Secondary | ICD-10-CM

## 2022-07-18 DIAGNOSIS — Z1231 Encounter for screening mammogram for malignant neoplasm of breast: Secondary | ICD-10-CM

## 2022-07-18 DIAGNOSIS — Z01411 Encounter for gynecological examination (general) (routine) with abnormal findings: Secondary | ICD-10-CM | POA: Diagnosis not present

## 2022-07-18 DIAGNOSIS — Z1211 Encounter for screening for malignant neoplasm of colon: Secondary | ICD-10-CM

## 2022-07-18 MED ORDER — OXYBUTYNIN CHLORIDE ER 15 MG PO TB24: 15.0000 mg | ORAL_TABLET | Freq: Every day | ORAL | 3 refills | Status: DC

## 2022-07-18 NOTE — Patient Instructions (Addendum)
I value your feedback and you entrusting us with your care. If you get a [facility] patient survey, I would appreciate you taking the time to let us know about your experience today. Thank you!  [facility]: [phone]      

## 2022-07-22 DIAGNOSIS — R030 Elevated blood-pressure reading, without diagnosis of hypertension: Secondary | ICD-10-CM | POA: Diagnosis not present

## 2022-07-22 DIAGNOSIS — Z6839 Body mass index (BMI) 39.0-39.9, adult: Secondary | ICD-10-CM | POA: Diagnosis not present

## 2022-07-22 DIAGNOSIS — Z8249 Family history of ischemic heart disease and other diseases of the circulatory system: Secondary | ICD-10-CM | POA: Diagnosis not present

## 2022-07-22 DIAGNOSIS — Z825 Family history of asthma and other chronic lower respiratory diseases: Secondary | ICD-10-CM | POA: Diagnosis not present

## 2022-07-22 DIAGNOSIS — R32 Unspecified urinary incontinence: Secondary | ICD-10-CM | POA: Diagnosis not present

## 2022-07-22 DIAGNOSIS — Z791 Long term (current) use of non-steroidal anti-inflammatories (NSAID): Secondary | ICD-10-CM | POA: Diagnosis not present

## 2022-07-22 DIAGNOSIS — R69 Illness, unspecified: Secondary | ICD-10-CM | POA: Diagnosis not present

## 2022-07-22 DIAGNOSIS — F3341 Major depressive disorder, recurrent, in partial remission: Secondary | ICD-10-CM | POA: Diagnosis not present

## 2022-07-22 DIAGNOSIS — E669 Obesity, unspecified: Secondary | ICD-10-CM | POA: Diagnosis not present

## 2022-07-28 DIAGNOSIS — F411 Generalized anxiety disorder: Secondary | ICD-10-CM | POA: Diagnosis not present

## 2022-07-28 DIAGNOSIS — R69 Illness, unspecified: Secondary | ICD-10-CM | POA: Diagnosis not present

## 2022-08-17 ENCOUNTER — Ambulatory Visit
Admission: RE | Admit: 2022-08-17 | Discharge: 2022-08-17 | Disposition: A | Discharge status: Home / Self Care | Payer: 59 | Source: Ambulatory Visit | Attending: Obstetrics and Gynecology | Admitting: Obstetrics and Gynecology

## 2022-08-17 DIAGNOSIS — Z1231 Encounter for screening mammogram for malignant neoplasm of breast: Secondary | ICD-10-CM | POA: Insufficient documentation

## 2022-08-22 ENCOUNTER — Telehealth: Payer: Self-pay

## 2022-08-22 NOTE — Telephone Encounter (Signed)
Pt returning Grecia's call.  [phone]

## 2022-08-22 NOTE — Telephone Encounter (Signed)
Received letter from [facility] that patients Rx oxybutynin had a recall and she should follow up with her pharmacy regarding her medication. No answer, LVMTRC.

## 2022-08-22 NOTE — Telephone Encounter (Signed)
Pt was already aware of recall, she also received a letter and contacted her pharmacy and was advised her Rx was not affected.

## 2022-09-05 DIAGNOSIS — Z6841 Body Mass Index (BMI) 40.0 and over, adult: Secondary | ICD-10-CM | POA: Diagnosis not present

## 2022-09-05 DIAGNOSIS — Z0000 Encounter for general adult medical examination without abnormal findings: Secondary | ICD-10-CM | POA: Diagnosis not present

## 2022-09-14 DIAGNOSIS — R69 Illness, unspecified: Secondary | ICD-10-CM | POA: Diagnosis not present

## 2022-09-14 DIAGNOSIS — F411 Generalized anxiety disorder: Secondary | ICD-10-CM | POA: Diagnosis not present

## 2022-10-09 DIAGNOSIS — R69 Illness, unspecified: Secondary | ICD-10-CM | POA: Diagnosis not present

## 2022-10-09 DIAGNOSIS — F411 Generalized anxiety disorder: Secondary | ICD-10-CM | POA: Diagnosis not present

## 2022-10-10 DIAGNOSIS — F331 Major depressive disorder, recurrent, moderate: Secondary | ICD-10-CM | POA: Diagnosis not present

## 2022-10-24 DIAGNOSIS — R519 Headache, unspecified: Secondary | ICD-10-CM | POA: Diagnosis not present

## 2022-10-24 DIAGNOSIS — E538 Deficiency of other specified B group vitamins: Secondary | ICD-10-CM | POA: Diagnosis not present

## 2022-10-24 DIAGNOSIS — R42 Dizziness and giddiness: Secondary | ICD-10-CM | POA: Diagnosis not present

## 2022-10-24 DIAGNOSIS — G479 Sleep disorder, unspecified: Secondary | ICD-10-CM | POA: Diagnosis not present

## 2022-10-24 DIAGNOSIS — R5383 Other fatigue: Secondary | ICD-10-CM | POA: Diagnosis not present

## 2022-10-24 DIAGNOSIS — G4719 Other hypersomnia: Secondary | ICD-10-CM | POA: Diagnosis not present

## 2022-10-26 DIAGNOSIS — E538 Deficiency of other specified B group vitamins: Secondary | ICD-10-CM | POA: Diagnosis not present

## 2022-11-07 DIAGNOSIS — F331 Major depressive disorder, recurrent, moderate: Secondary | ICD-10-CM | POA: Diagnosis not present

## 2022-11-09 DIAGNOSIS — E538 Deficiency of other specified B group vitamins: Secondary | ICD-10-CM | POA: Diagnosis not present

## 2022-11-23 DIAGNOSIS — E538 Deficiency of other specified B group vitamins: Secondary | ICD-10-CM | POA: Diagnosis not present

## 2023-01-02 DIAGNOSIS — F411 Generalized anxiety disorder: Secondary | ICD-10-CM | POA: Diagnosis not present

## 2023-01-02 DIAGNOSIS — F331 Major depressive disorder, recurrent, moderate: Secondary | ICD-10-CM | POA: Diagnosis not present

## 2023-01-15 DIAGNOSIS — F411 Generalized anxiety disorder: Secondary | ICD-10-CM | POA: Diagnosis not present

## 2023-01-15 DIAGNOSIS — F331 Major depressive disorder, recurrent, moderate: Secondary | ICD-10-CM | POA: Diagnosis not present

## 2023-01-22 DIAGNOSIS — F331 Major depressive disorder, recurrent, moderate: Secondary | ICD-10-CM | POA: Diagnosis not present

## 2023-02-28 DIAGNOSIS — F411 Generalized anxiety disorder: Secondary | ICD-10-CM | POA: Diagnosis not present

## 2023-02-28 DIAGNOSIS — Z8249 Family history of ischemic heart disease and other diseases of the circulatory system: Secondary | ICD-10-CM | POA: Diagnosis not present

## 2023-02-28 DIAGNOSIS — N3941 Urge incontinence: Secondary | ICD-10-CM | POA: Diagnosis not present

## 2023-02-28 DIAGNOSIS — F325 Major depressive disorder, single episode, in full remission: Secondary | ICD-10-CM | POA: Diagnosis not present

## 2023-03-20 DIAGNOSIS — F331 Major depressive disorder, recurrent, moderate: Secondary | ICD-10-CM | POA: Diagnosis not present

## 2023-03-20 DIAGNOSIS — F411 Generalized anxiety disorder: Secondary | ICD-10-CM | POA: Diagnosis not present

## 2023-05-17 DIAGNOSIS — E559 Vitamin D deficiency, unspecified: Secondary | ICD-10-CM | POA: Diagnosis not present

## 2023-05-17 DIAGNOSIS — R1310 Dysphagia, unspecified: Secondary | ICD-10-CM | POA: Diagnosis not present

## 2023-05-17 DIAGNOSIS — R7303 Prediabetes: Secondary | ICD-10-CM | POA: Diagnosis not present

## 2023-05-17 DIAGNOSIS — R5383 Other fatigue: Secondary | ICD-10-CM | POA: Diagnosis not present

## 2023-05-17 DIAGNOSIS — K219 Gastro-esophageal reflux disease without esophagitis: Secondary | ICD-10-CM | POA: Diagnosis not present

## 2023-05-17 DIAGNOSIS — F32A Depression, unspecified: Secondary | ICD-10-CM | POA: Diagnosis not present

## 2023-05-17 DIAGNOSIS — E538 Deficiency of other specified B group vitamins: Secondary | ICD-10-CM | POA: Diagnosis not present

## 2023-05-17 DIAGNOSIS — Z0000 Encounter for general adult medical examination without abnormal findings: Secondary | ICD-10-CM | POA: Diagnosis not present

## 2023-05-17 DIAGNOSIS — F419 Anxiety disorder, unspecified: Secondary | ICD-10-CM | POA: Diagnosis not present

## 2023-05-17 DIAGNOSIS — N3281 Overactive bladder: Secondary | ICD-10-CM | POA: Diagnosis not present

## 2023-05-21 DIAGNOSIS — F331 Major depressive disorder, recurrent, moderate: Secondary | ICD-10-CM | POA: Diagnosis not present

## 2023-06-15 DIAGNOSIS — F331 Major depressive disorder, recurrent, moderate: Secondary | ICD-10-CM | POA: Diagnosis not present

## 2023-06-15 DIAGNOSIS — F411 Generalized anxiety disorder: Secondary | ICD-10-CM | POA: Diagnosis not present

## 2023-07-22 NOTE — Progress Notes (Unsigned)
No chief complaint on file.    HPI:      [name] is a 62 y.o. [phone] who LMP was No LMP recorded. Patient is postmenopausal., presents today for her annual examination. Her menses are absent due to menopause. She does not have PMB. She does not have vasomotor sx.    Sex activity: not sexually active. She does not have vaginal dryness/sx.   Last Pap: 07/14/21  Results were: no abnormalities /neg HPV DNA.  Hx of STDs: none   Last mammogram: 08/17/22 Results were: normal--routine follow-up in 12 months There is no FH of breast cancer. There is no FH of ovarian cancer. The patient does occas do self-breast exams.    Colonoscopy: colonoscopy 2015 without abnormalities. Repeat due after 10 yrs.    Tobacco use: The patient denies current or previous tobacco use. Alcohol use: none No drug use Exercise: min active; plans to start exercising again    She does get adequate calcium and Vitamin D in her diet.   She has a hx of urinary urgency/OAB sx with urge incont. Doing ditropan XL 15 mg with sx improvement. She occas has SUI sx with cough. No hx of glaucoma.  Labs with PCP  Past Medical History:  Diagnosis Date   Anxiety and depression    Granuloma annulare    Mixed incontinence urge and stress    OAB (overactive bladder)    Obesity    Rheumatic fever     Past Surgical History:  Procedure Laterality Date   CESAREAN SECTION     COLONOSCOPY  2015   NASAL SINUS SURGERY      Family History  Problem Relation Age of Onset   Heart attack Father    Heart disease Father    Diabetes Father    Hypertension Mother    Alzheimer's disease Mother    Breast cancer Neg Hx     Social History   Socioeconomic History   Marital status: Legally Separated    Spouse name: Not on file   Number of children: Not on file   Years of education: Not on file   Highest education level: Not on file  Occupational History   Not on file  Tobacco Use   Smoking status: Never    Smokeless tobacco: Never  Vaping Use   Vaping status: Never Used  Substance and Sexual Activity   Alcohol use: No    Alcohol/week: 0.0 standard drinks of alcohol   Drug use: No   Sexual activity: Not Currently    Birth control/protection: Post-menopausal  Other Topics Concern   Not on file  Social History Narrative   Divorced.   Twin girls, 1 son.   Works as a Engineer, materials   Enjoys swimming, walking, spending time with friends.   Social Determinants of Health   Financial Resource Strain: High Risk (05/17/2023)   Received from [facility]   Overall Financial Resource Strain (CARDIA)    Difficulty of Paying Living Expenses: Hard  Food Insecurity: Food Insecurity Present (05/17/2023)   Received from [facility]   Hunger Vital Sign    Worried About Running Out of Food in the Last Year: Sometimes true    Ran Out of Food in the Last Year: Sometimes true  Transportation Needs: No Transportation Needs (05/17/2023)   Received from [facility] - Transportation    In the past 12 months, has lack of transportation kept  you from medical appointments or from getting medications?: No    Lack of Transportation (Non-Medical): No  Physical Activity: Insufficiently Active (02/19/2019)   Exercise Vital Sign    Days of Exercise per Week: 2 days    Minutes of Exercise per Session: 50 min  Stress: Not on file  Social Connections: Not on file  Intimate Partner Violence: Not on file    Current Outpatient Medications on File Prior to Visit  Medication Sig Dispense Refill   ARIPiprazole (ABILIFY) 5 MG tablet Take by mouth.     cholecalciferol (VITAMIN D) 1000 units tablet Take 1,000 Units by mouth daily.     oxybutynin (DITROPAN XL) 15 MG 24 hr tablet Take 1 tablet (15 mg total) by mouth daily. 90 tablet 3   sertraline (ZOLOFT) 100 MG tablet Take 100 mg by mouth daily.      venlafaxine XR (EFFEXOR-XR) 75 MG 24 hr capsule Take 75  mg by mouth daily.     No current facility-administered medications on file prior to visit.      ROS:  Review of Systems  Constitutional:  Negative for fatigue, fever and unexpected weight change.  Respiratory:  Negative for cough, shortness of breath and wheezing.   Cardiovascular:  Negative for chest pain, palpitations and leg swelling.  Gastrointestinal:  Negative for blood in stool, constipation, diarrhea, nausea and vomiting.  Endocrine: Negative for cold intolerance, heat intolerance and polyuria.  Genitourinary:  Negative for dyspareunia, dysuria, flank pain, frequency, genital sores, hematuria, menstrual problem, pelvic pain, urgency, vaginal bleeding, vaginal discharge and vaginal pain.  Musculoskeletal:  Negative for back pain, joint swelling and myalgias.  Skin:  Positive for rash.  Neurological:  Negative for dizziness, syncope, light-headedness, numbness and headaches.  Hematological:  Negative for adenopathy.  Psychiatric/Behavioral:  Positive for agitation and dysphoric mood. Negative for confusion, sleep disturbance and suicidal ideas. The patient is not nervous/anxious.      Objective: There were no vitals taken for this visit.   Physical Exam Constitutional:      Appearance: She is well-developed.  Genitourinary:     Vulva normal.     Right Labia: No rash, tenderness or lesions.    Left Labia: No tenderness, lesions or rash.    No vaginal discharge, erythema or tenderness.      Right Adnexa: not tender and no mass present.    Left Adnexa: not tender and no mass present.    No cervical friability or polyp.     Uterus is not enlarged or tender.  Breasts:    Right: No mass, nipple discharge, skin change or tenderness.     Left: No mass, nipple discharge, skin change or tenderness.  Neck:     Thyroid: No thyromegaly.  Cardiovascular:     Rate and Rhythm: Normal rate and regular rhythm.     Heart sounds: Normal heart sounds. No murmur heard. Pulmonary:      Effort: Pulmonary effort is normal.     Breath sounds: Normal breath sounds.  Abdominal:     Palpations: Abdomen is soft.     Tenderness: There is no abdominal tenderness. There is no guarding or rebound.  Musculoskeletal:        General: Normal range of motion.     Cervical back: Normal range of motion.  Lymphadenopathy:     Cervical: No cervical adenopathy.  Neurological:     General: No focal deficit present.     Mental Status: She is alert and oriented to person,  place, and time.     Cranial Nerves: No cranial nerve deficit.  Skin:    General: Skin is warm and dry.  Psychiatric:        Mood and Affect: Mood normal.        Behavior: Behavior normal.        Thought Content: Thought content normal.        Judgment: Judgment normal.  Vitals reviewed.      Assessment/Plan: Encounter for annual routine gynecological examination  Encounter for screening mammogram for malignant neoplasm of breast - Plan: MM 3D SCREEN BREAST BILATERAL; pt to schedule mammo  OAB (overactive bladder) - Plan: oxybutynin (DITROPAN XL) 15 MG 24 hr tablet; sx improved, Rx RF.   Urge incontinence - Plan: oxybutynin (DITROPAN XL) 15 MG 24 hr tablet   No orders of the defined types were placed in this encounter.             GYN counsel breast self exam, mammography screening, menopause, adequate intake of calcium and vitamin D, diet and exercise     F/U  No follow-ups on file.  Chue Berkovich B. Donnie Gedeon, PA-C 07/22/2023 7:27 PM

## 2023-07-23 ENCOUNTER — Encounter: Payer: Self-pay | Admitting: Obstetrics and Gynecology

## 2023-07-23 ENCOUNTER — Ambulatory Visit (INDEPENDENT_AMBULATORY_CARE_PROVIDER_SITE_OTHER): Payer: 59 | Admitting: Obstetrics and Gynecology

## 2023-07-23 VITALS — BP 128/76 | HR 61 | Ht 62.5 in | Wt 227.0 lb

## 2023-07-23 DIAGNOSIS — Z01419 Encounter for gynecological examination (general) (routine) without abnormal findings: Secondary | ICD-10-CM | POA: Diagnosis not present

## 2023-07-23 DIAGNOSIS — N3941 Urge incontinence: Secondary | ICD-10-CM

## 2023-07-23 DIAGNOSIS — Z1231 Encounter for screening mammogram for malignant neoplasm of breast: Secondary | ICD-10-CM

## 2023-07-23 DIAGNOSIS — N3281 Overactive bladder: Secondary | ICD-10-CM

## 2023-07-23 MED ORDER — OXYBUTYNIN CHLORIDE ER 15 MG PO TB24: 15.0000 mg | ORAL_TABLET | Freq: Every day | ORAL | 3 refills | Status: DC

## 2023-07-23 NOTE — Patient Instructions (Signed)
I value your feedback and you entrusting us with your care. If you get a [facility] patient survey, I would appreciate you taking the time to let us know about your experience today. Thank you!  [facility] ([place])--[phone]  

## 2023-07-26 DIAGNOSIS — Z23 Encounter for immunization: Secondary | ICD-10-CM | POA: Diagnosis not present

## 2023-07-30 ENCOUNTER — Encounter: Payer: Self-pay | Admitting: Obstetrics and Gynecology

## 2023-07-30 DIAGNOSIS — N3941 Urge incontinence: Secondary | ICD-10-CM

## 2023-07-30 DIAGNOSIS — N3281 Overactive bladder: Secondary | ICD-10-CM

## 2023-07-31 ENCOUNTER — Other Ambulatory Visit: Payer: Self-pay | Admitting: Obstetrics and Gynecology

## 2023-07-31 DIAGNOSIS — N3941 Urge incontinence: Secondary | ICD-10-CM

## 2023-07-31 DIAGNOSIS — N3281 Overactive bladder: Secondary | ICD-10-CM

## 2023-07-31 MED ORDER — GEMTESA 75 MG PO TABS: 75.0000 mg | ORAL_TABLET | Freq: Every day | ORAL | 3 refills | Status: DC

## 2023-08-01 ENCOUNTER — Other Ambulatory Visit: Payer: Self-pay | Admitting: Obstetrics and Gynecology

## 2023-08-01 DIAGNOSIS — N3281 Overactive bladder: Secondary | ICD-10-CM

## 2023-08-01 DIAGNOSIS — N3941 Urge incontinence: Secondary | ICD-10-CM

## 2023-08-13 NOTE — Telephone Encounter (Signed)
So see what we have to do with PA since not getting effective relief of OAB sx with oxybutynin. Thx.

## 2023-08-13 NOTE — Telephone Encounter (Signed)
Can you call to see if myrbetriq is covered and doesn't need PA? Thx.

## 2023-08-13 NOTE — Telephone Encounter (Signed)
Generic and brand both not covered.

## 2023-08-13 NOTE — Telephone Encounter (Signed)
[place] PA submitted and response was:  Your PA request cannot be processed for the member plan submitted. For further inquiries please contact the number on the back of the member prescription card.  Would you like for me to contact # on back of card? Or would you like to send other option (myrbetriq)?

## 2023-08-16 NOTE — Telephone Encounter (Signed)
Community education officer [facility] #: [phone]

## 2023-08-16 NOTE — Telephone Encounter (Signed)
Contacted [employer] and was advised pt's curred ins ID # expires 09/11/23 and that most likely that was the reason I couldn't complete the PA through covermymeds. Representative created a KEY # for me: [id number]. PA submitted and waiting for response.

## 2023-08-21 ENCOUNTER — Ambulatory Visit
Admission: RE | Admit: 2023-08-21 | Discharge: 2023-08-21 | Disposition: A | Discharge status: Home / Self Care | Payer: 59 | Source: Ambulatory Visit | Attending: Obstetrics and Gynecology | Admitting: Obstetrics and Gynecology

## 2023-08-21 DIAGNOSIS — Z1231 Encounter for screening mammogram for malignant neoplasm of breast: Secondary | ICD-10-CM | POA: Diagnosis not present

## 2023-08-21 NOTE — Telephone Encounter (Signed)
Appeal form filled out and faxed

## 2023-08-21 NOTE — Telephone Encounter (Signed)
PA denied:  Your plan only covers this drug when you meet one of these options: A) You have tried other products your plan covers (preferred products), and they did not work well for you, or B) Your doctor gives [place] a medical reason you cannot take those other products. For your plan, you may need to try up to three preferred products. We have denied your request because you do not meet any of these conditions. We reviewed the information we had. Your request has been denied. Your doctor can send [place] any new or missing information for [place] to review. The preferred products for your plan are: darifenacin extendedrelease, fesoterodine, oxybutynin, oxybutynin extended-release, solifenacin, tolterodine, tolterodine extended-release, trospium, trospium extended-release.  Called PA department and was advised to fill out an appeal form and maybe this Rx can be considered as 3rd medication tried. Will try to submit appeal today.

## 2023-08-21 NOTE — Telephone Encounter (Signed)
Thank you. We can try fesoterodine if don't get appeal approved.

## 2023-09-13 DIAGNOSIS — F331 Major depressive disorder, recurrent, moderate: Secondary | ICD-10-CM | POA: Diagnosis not present

## 2023-09-13 DIAGNOSIS — F411 Generalized anxiety disorder: Secondary | ICD-10-CM | POA: Diagnosis not present

## 2023-09-25 ENCOUNTER — Other Ambulatory Visit: Payer: Self-pay | Admitting: Obstetrics and Gynecology

## 2023-09-25 DIAGNOSIS — N3941 Urge incontinence: Secondary | ICD-10-CM

## 2023-09-25 DIAGNOSIS — N3281 Overactive bladder: Secondary | ICD-10-CM

## 2023-09-25 MED ORDER — FESOTERODINE FUMARATE ER 4 MG PO TB24: 4.0000 mg | ORAL_TABLET | Freq: Every day | ORAL | 2 refills | Status: DC

## 2023-09-25 NOTE — Progress Notes (Signed)
 Rx fesoterodine for OAB. F/u in 3 months via phone re: OAB/urge incont sx.

## 2023-09-25 NOTE — Telephone Encounter (Signed)
 This came in while I was out on vacation. These scans were not attached to encounter. I have attached them. Will you call in the fesoterodine?

## 2023-09-27 DIAGNOSIS — Z1211 Encounter for screening for malignant neoplasm of colon: Secondary | ICD-10-CM | POA: Diagnosis not present

## 2023-09-27 DIAGNOSIS — K219 Gastro-esophageal reflux disease without esophagitis: Secondary | ICD-10-CM | POA: Diagnosis not present

## 2023-09-27 DIAGNOSIS — R1310 Dysphagia, unspecified: Secondary | ICD-10-CM | POA: Diagnosis not present

## 2023-10-09 DIAGNOSIS — F331 Major depressive disorder, recurrent, moderate: Secondary | ICD-10-CM | POA: Diagnosis not present

## 2023-10-09 DIAGNOSIS — F411 Generalized anxiety disorder: Secondary | ICD-10-CM | POA: Diagnosis not present

## 2023-10-26 DIAGNOSIS — N3281 Overactive bladder: Secondary | ICD-10-CM

## 2023-10-26 DIAGNOSIS — N3941 Urge incontinence: Secondary | ICD-10-CM

## 2023-11-01 ENCOUNTER — Other Ambulatory Visit: Payer: Self-pay | Admitting: Obstetrics and Gynecology

## 2023-11-01 DIAGNOSIS — N3941 Urge incontinence: Secondary | ICD-10-CM

## 2023-11-01 DIAGNOSIS — N3281 Overactive bladder: Secondary | ICD-10-CM

## 2023-11-01 MED ORDER — TOLTERODINE TARTRATE ER 4 MG PO CP24: 4.0000 mg | ORAL_CAPSULE | Freq: Every day | ORAL | 1 refills | Status: DC

## 2023-11-01 NOTE — Telephone Encounter (Signed)
Pt "failed" ditropan and now toviaz for OAB sx.

## 2023-11-02 NOTE — Telephone Encounter (Signed)
Pls look into this. This is one of the medications that is listed as approved alternative on the prior auth fax (see note towards end of message thread). Thx.

## 2023-12-03 ENCOUNTER — Other Ambulatory Visit: Payer: Self-pay | Admitting: Obstetrics and Gynecology

## 2023-12-03 DIAGNOSIS — N3941 Urge incontinence: Secondary | ICD-10-CM

## 2023-12-03 DIAGNOSIS — N3281 Overactive bladder: Secondary | ICD-10-CM

## 2023-12-03 MED ORDER — ESTRADIOL 0.1 MG/GM VA CREA: TOPICAL_CREAM | VAGINAL | 0 refills | Status: DC

## 2023-12-03 NOTE — Addendum Note (Signed)
 Addended by: Althea Grimmer B on: 12/03/2023 04:59 PM   Modules accepted: Orders

## 2024-01-03 ENCOUNTER — Encounter: Payer: Self-pay | Admitting: *Deleted

## 2024-01-04 ENCOUNTER — Ambulatory Visit: Admitting: Anesthesiology

## 2024-01-04 ENCOUNTER — Ambulatory Visit
Admission: RE | Admit: 2024-01-04 | Discharge: 2024-01-04 | Disposition: A | Discharge status: Home / Self Care | Payer: 59 | Attending: Gastroenterology | Admitting: Gastroenterology

## 2024-01-04 ENCOUNTER — Encounter: Payer: Self-pay | Admitting: *Deleted

## 2024-01-04 ENCOUNTER — Encounter
Admission: RE | Disposition: A | Discharge status: Home / Self Care | Payer: Self-pay | Source: Home / Self Care | Attending: Gastroenterology

## 2024-01-04 ENCOUNTER — Other Ambulatory Visit: Payer: Self-pay

## 2024-01-04 ENCOUNTER — Other Ambulatory Visit: Payer: Self-pay | Admitting: Obstetrics and Gynecology

## 2024-01-04 DIAGNOSIS — D124 Benign neoplasm of descending colon: Secondary | ICD-10-CM | POA: Insufficient documentation

## 2024-01-04 DIAGNOSIS — K635 Polyp of colon: Secondary | ICD-10-CM | POA: Insufficient documentation

## 2024-01-04 DIAGNOSIS — Z1211 Encounter for screening for malignant neoplasm of colon: Secondary | ICD-10-CM | POA: Insufficient documentation

## 2024-01-04 DIAGNOSIS — N3281 Overactive bladder: Secondary | ICD-10-CM

## 2024-01-04 DIAGNOSIS — R7303 Prediabetes: Secondary | ICD-10-CM | POA: Diagnosis not present

## 2024-01-04 DIAGNOSIS — K449 Diaphragmatic hernia without obstruction or gangrene: Secondary | ICD-10-CM | POA: Insufficient documentation

## 2024-01-04 DIAGNOSIS — R1310 Dysphagia, unspecified: Secondary | ICD-10-CM | POA: Insufficient documentation

## 2024-01-04 DIAGNOSIS — K2100 Gastro-esophageal reflux disease with esophagitis, without bleeding: Secondary | ICD-10-CM | POA: Insufficient documentation

## 2024-01-04 DIAGNOSIS — K2970 Gastritis, unspecified, without bleeding: Secondary | ICD-10-CM | POA: Diagnosis not present

## 2024-01-04 DIAGNOSIS — D123 Benign neoplasm of transverse colon: Secondary | ICD-10-CM | POA: Diagnosis not present

## 2024-01-04 DIAGNOSIS — D122 Benign neoplasm of ascending colon: Secondary | ICD-10-CM | POA: Insufficient documentation

## 2024-01-04 DIAGNOSIS — N3941 Urge incontinence: Secondary | ICD-10-CM

## 2024-01-04 DIAGNOSIS — Z79899 Other long term (current) drug therapy: Secondary | ICD-10-CM | POA: Diagnosis not present

## 2024-01-04 DIAGNOSIS — F419 Anxiety disorder, unspecified: Secondary | ICD-10-CM | POA: Diagnosis not present

## 2024-01-04 DIAGNOSIS — F32A Depression, unspecified: Secondary | ICD-10-CM | POA: Diagnosis not present

## 2024-01-04 DIAGNOSIS — Z6841 Body Mass Index (BMI) 40.0 and over, adult: Secondary | ICD-10-CM | POA: Diagnosis not present

## 2024-01-04 DIAGNOSIS — K640 First degree hemorrhoids: Secondary | ICD-10-CM | POA: Diagnosis not present

## 2024-01-04 DIAGNOSIS — E669 Obesity, unspecified: Secondary | ICD-10-CM | POA: Diagnosis not present

## 2024-01-04 DIAGNOSIS — K222 Esophageal obstruction: Secondary | ICD-10-CM | POA: Diagnosis not present

## 2024-01-04 HISTORY — PX: ESOPHAGOGASTRODUODENOSCOPY (EGD) WITH PROPOFOL: SHX5813

## 2024-01-04 HISTORY — PX: POLYPECTOMY: SHX149

## 2024-01-04 HISTORY — PX: COLONOSCOPY WITH PROPOFOL: SHX5780

## 2024-01-04 SURGERY — COLONOSCOPY WITH PROPOFOL
Anesthesia: General

## 2024-01-04 MED ORDER — GLYCOPYRROLATE 0.2 MG/ML IJ SOLN
INTRAMUSCULAR | Status: DC | PRN
  Administered 2024-01-04: .2 mg via INTRAVENOUS

## 2024-01-04 MED ORDER — SODIUM CHLORIDE 0.9 % IV SOLN: INTRAVENOUS | Status: DC

## 2024-01-04 MED ORDER — PROPOFOL 500 MG/50ML IV EMUL
INTRAVENOUS | Status: DC | PRN
  Administered 2024-01-04: 75 ug/kg/min via INTRAVENOUS

## 2024-01-04 MED ORDER — LIDOCAINE HCL (CARDIAC) PF 100 MG/5ML IV SOSY
PREFILLED_SYRINGE | INTRAVENOUS | Status: DC | PRN
  Administered 2024-01-04: 80 mg via INTRAVENOUS

## 2024-01-04 MED ORDER — PROPOFOL 10 MG/ML IV BOLUS
INTRAVENOUS | Status: DC | PRN
  Administered 2024-01-04 (×2): 50 mg via INTRAVENOUS
  Administered 2024-01-04 (×2): 20 mg via INTRAVENOUS

## 2024-01-04 MED ORDER — DEXMEDETOMIDINE HCL IN NACL 80 MCG/20ML IV SOLN
INTRAVENOUS | Status: DC | PRN
Start: 2024-01-04 — End: 2024-01-04
  Administered 2024-01-04: 20 ug via INTRAVENOUS

## 2024-01-04 NOTE — Op Note (Signed)
 [facility] Patient Name: Bridget Villegas Procedure Date: 01/04/2024 9:06 AM MRN: [id number] Account #: [id number] Date of Birth: 1961-05-29 Admit Type: Outpatient Age: 63 Room: [facility] ENDO ROOM 3 Gender: Female Note Status: Finalized Instrument Name: Charlyn Cooley [id number] Procedure:             Colonoscopy Indications:           Screening for colorectal malignant neoplasm Providers:             Leida Puna MD, MD Medicines:             Monitored Anesthesia Care Complications:         No immediate complications. Estimated blood loss:                         Minimal. Procedure:             Pre-Anesthesia Assessment:                        - Prior to the procedure, a History and Physical was                         performed, and patient medications and allergies were                         reviewed. The patient is competent. The risks and                         benefits of the procedure and the sedation options and                         risks were discussed with the patient. All questions                         were answered and informed consent was obtained.                         Patient identification and proposed procedure were                         verified by the physician, the nurse, the                         anesthesiologist, the anesthetist and the technician                         in the endoscopy suite. Mental Status Examination:                         alert and oriented. Airway Examination: normal                         oropharyngeal airway and neck mobility. Respiratory                         Examination: clear to auscultation. CV Examination:                         normal. Prophylactic Antibiotics: The patient does not  require prophylactic antibiotics. Prior                         Anticoagulants: The patient has taken no anticoagulant                         or antiplatelet agents. ASA Grade  Assessment: II - A                         patient with mild systemic disease. After reviewing                         the risks and benefits, the patient was deemed in                         satisfactory condition to undergo the procedure. The                         anesthesia plan was to use monitored anesthesia care                         (MAC). Immediately prior to administration of                         medications, the patient was re-assessed for adequacy                         to receive sedatives. The heart rate, respiratory                         rate, oxygen saturations, blood pressure, adequacy of                         pulmonary ventilation, and response to care were                         monitored throughout the procedure. The physical                         status of the patient was re-assessed after the                         procedure.                        After obtaining informed consent, the colonoscope was                         passed under direct vision. Throughout the procedure,                         the patient's blood pressure, pulse, and oxygen                         saturations were monitored continuously. The                         Colonoscope was introduced through the anus and  advanced to the the cecum, identified by appendiceal                         orifice and ileocecal valve. The colonoscopy was                         performed without difficulty. The patient tolerated                         the procedure well. The quality of the bowel                         preparation was good. The ileocecal valve, appendiceal                         orifice, and rectum were photographed. Findings:      The perianal and digital rectal examinations were normal.      A 3 mm polyp was found in the ascending colon. The polyp was sessile.       The polyp was removed with a cold snare. Resection and retrieval were       complete.  Estimated blood loss was minimal.      Four sessile polyps were found in the transverse colon. The polyps were       3 to 8 mm in size. These polyps were removed with a cold snare.       Resection and retrieval were complete. Estimated blood loss was minimal.      Two sessile polyps were found in the descending colon. The polyps were 3       to 4 mm in size. These polyps were removed with a cold snare. Resection       and retrieval were complete. Estimated blood loss was minimal.      A 3 mm polyp was found in the sigmoid colon. The polyp was sessile. The       polyp was removed with a cold snare. Resection and retrieval were       complete. Estimated blood loss was minimal.      Internal hemorrhoids were found during retroflexion. The hemorrhoids       were Grade I (internal hemorrhoids that do not prolapse).      The exam was otherwise without abnormality on direct and retroflexion       views. Impression:            - One 3 mm polyp in the ascending colon, removed with                         a cold snare. Resected and retrieved.                        - Four 3 to 8 mm polyps in the transverse colon,                         removed with a cold snare. Resected and retrieved.                        - Two 3 to 4 mm polyps in the descending colon,  removed with a cold snare. Resected and retrieved.                        - One 3 mm polyp in the sigmoid colon, removed with a                         cold snare. Resected and retrieved.                        - Internal hemorrhoids.                        - The examination was otherwise normal on direct and                         retroflexion views. Recommendation:        - Discharge patient to home.                        - Resume previous diet.                        - Continue present medications.                        - Await pathology results.                        - Repeat colonoscopy in 3 years for  surveillance.                        - Return to referring physician as previously                         scheduled. Procedure Code(s):     --- Professional ---                        [phone], Colonoscopy, flexible; with removal of                         tumor(s), polyp(s), or other lesion(s) by snare                         technique Diagnosis Code(s):     --- Professional ---                        Z12.11, Encounter for screening for malignant neoplasm                         of colon                        D12.2, Benign neoplasm of ascending colon                        D12.5, Benign neoplasm of sigmoid colon                        D12.3, Benign neoplasm of transverse colon (hepatic  flexure or splenic flexure)                        D12.4, Benign neoplasm of descending colon                        K64.0, First degree hemorrhoids CPT copyright 2022 American [employer] Association. All rights reserved. The codes documented in this report are preliminary and upon coder review may  be revised to meet current compliance requirements. Leida Puna MD, MD 01/04/2024 10:12:37 AM Number of Addenda: 0 Note Initiated On: 01/04/2024 9:06 AM Scope Withdrawal Time: 0 hours 12 minutes 58 seconds  Total Procedure Duration: 0 hours 18 minutes 22 seconds  Estimated Blood Loss:  Estimated blood loss was minimal.      [facility]

## 2024-01-04 NOTE — Op Note (Signed)
 [facility] Patient Name: Bridget Villegas Procedure Date: 01/04/2024 9:06 AM MRN: [id number] Account #: [id number] Date of Birth: Jan 22, 1961 Admit Type: Outpatient Age: 63 Room: [facility] ENDO ROOM 3 Gender: Female Note Status: Finalized Instrument Name: Cristino Donna Endoscope [id number] Procedure:             Upper GI endoscopy Indications:           Dysphagia Providers:             Leida Puna MD, MD Medicines:             Monitored Anesthesia Care Complications:         No immediate complications. Estimated blood loss:                         Minimal. Procedure:             Pre-Anesthesia Assessment:                        - Prior to the procedure, a History and Physical was                         performed, and patient medications and allergies were                         reviewed. The patient is competent. The risks and                         benefits of the procedure and the sedation options and                         risks were discussed with the patient. All questions                         were answered and informed consent was obtained.                         Patient identification and proposed procedure were                         verified by the physician, the nurse, the                         anesthesiologist, the anesthetist and the technician                         in the endoscopy suite. Mental Status Examination:                         alert and oriented. Airway Examination: normal                         oropharyngeal airway and neck mobility. Respiratory                         Examination: clear to auscultation. CV Examination:                         normal. Prophylactic Antibiotics: The patient does not  require prophylactic antibiotics. Prior                         Anticoagulants: The patient has taken no anticoagulant                         or antiplatelet agents. ASA Grade Assessment: II - A                          patient with mild systemic disease. After reviewing                         the risks and benefits, the patient was deemed in                         satisfactory condition to undergo the procedure. The                         anesthesia plan was to use monitored anesthesia care                         (MAC). Immediately prior to administration of                         medications, the patient was re-assessed for adequacy                         to receive sedatives. The heart rate, respiratory                         rate, oxygen saturations, blood pressure, adequacy of                         pulmonary ventilation, and response to care were                         monitored throughout the procedure. The physical                         status of the patient was re-assessed after the                         procedure.                        After obtaining informed consent, the endoscope was                         passed under direct vision. Throughout the procedure,                         the patient's blood pressure, pulse, and oxygen                         saturations were monitored continuously. The Endoscope                         was introduced through the mouth, and advanced to the  second part of duodenum. The upper GI endoscopy was                         accomplished without difficulty. The patient tolerated                         the procedure well. Findings:      A 4 cm hiatal hernia was present.      A widely patent Schatzki ring was found in the lower third of the       esophagus.      LA Grade A (one or more mucosal breaks less than 5 mm, not extending       between tops of 2 mucosal folds) esophagitis with no bleeding was found.      Patchy moderate inflammation characterized by erythema was found in the       gastric antrum. Biopsies were taken with a cold forceps for Helicobacter       pylori testing. Estimated blood loss  was minimal.      The examined duodenum was normal. Impression:            - 4 cm hiatal hernia.                        - Widely patent Schatzki ring.                        - LA Grade A reflux esophagitis with no bleeding.                        - Gastritis. Biopsied.                        - Normal examined duodenum. Recommendation:        - Discharge patient to home.                        - Resume previous diet.                        - Continue present medications.                        - Await pathology results.                        - Use a proton pump inhibitor PO daily.                        - If continued symptoms after PPI trial, would                         recommend barium swallow. Procedure Code(s):     --- Professional ---                        [phone], Esophagogastroduodenoscopy, flexible,                         transoral; with biopsy, single or multiple Diagnosis Code(s):     --- Professional ---  K44.9, Diaphragmatic hernia without obstruction or                         gangrene                        K22.2, Esophageal obstruction                        K21.00, Gastro-esophageal reflux disease with                         esophagitis, without bleeding                        K29.70, Gastritis, unspecified, without bleeding                        R13.10, Dysphagia, unspecified CPT copyright 2022 American Medical Association. All rights reserved. The codes documented in this report are preliminary and upon coder review may  be revised to meet current compliance requirements. Leida Puna MD, MD 01/04/2024 10:09:36 AM Number of Addenda: 0 Note Initiated On: 01/04/2024 9:06 AM Estimated Blood Loss:  Estimated blood loss was minimal.      [facility]

## 2024-01-04 NOTE — Anesthesia Preprocedure Evaluation (Addendum)
 Anesthesia Evaluation  Patient identified by MRN, date of birth, ID band Patient awake    Reviewed: Allergy & Precautions, NPO status , Patient's Chart, lab work & pertinent test results  History of Anesthesia Complications Negative for: history of anesthetic complications  Airway Mallampati: III   Neck ROM: Full    Dental  (+) Missing   Pulmonary neg pulmonary ROS   Pulmonary exam normal breath sounds clear to auscultation       Cardiovascular Exercise Tolerance: Good negative cardio ROS Normal cardiovascular exam Rhythm:Regular Rate:Normal     Neuro/Psych  PSYCHIATRIC DISORDERS Anxiety Depression    negative neurological ROS     GI/Hepatic negative GI ROS,,,  Endo/Other  Obesity; prediabetes  Renal/GU negative Renal ROS     Musculoskeletal   Abdominal   Peds  Hematology negative hematology ROS (+)   Anesthesia Other Findings   Reproductive/Obstetrics                             Anesthesia Physical Anesthesia Plan  ASA: 2  Anesthesia Plan: General   Post-op Pain Management:    Induction: Intravenous  PONV Risk Score and Plan: 3 and Propofol  infusion, TIVA and Treatment may vary due to age or medical condition  Airway Management Planned: Natural Airway  Additional Equipment:   Intra-op Plan:   Post-operative Plan:   Informed Consent: I have reviewed the patients History and Physical, chart, labs and discussed the procedure including the risks, benefits and alternatives for the proposed anesthesia with the patient or authorized representative who has indicated his/her understanding and acceptance.       Plan Discussed with: CRNA  Anesthesia Plan Comments: (LMA/GETA backup discussed.  Patient consented for risks of anesthesia including but not limited to:  - adverse reactions to medications - damage to eyes, teeth, lips or other oral mucosa - nerve damage due to  positioning  - sore throat or hoarseness - damage to heart, brain, nerves, lungs, other parts of body or loss of life  Informed patient about role of CRNA in peri- and intra-operative care.  Patient voiced understanding.)       Anesthesia Quick Evaluation

## 2024-01-04 NOTE — Anesthesia Postprocedure Evaluation (Signed)
 Anesthesia Post Note  Patient: DHRITHI RICHE  Procedure(s) Performed: COLONOSCOPY WITH PROPOFOL  ESOPHAGOGASTRODUODENOSCOPY (EGD) WITH PROPOFOL  POLYPECTOMY, INTESTINE  Patient location during evaluation: PACU Anesthesia Type: General Level of consciousness: awake and alert, oriented and patient cooperative Pain management: pain level controlled Vital Signs Assessment: post-procedure vital signs reviewed and stable Respiratory status: spontaneous breathing, nonlabored ventilation and respiratory function stable Cardiovascular status: blood pressure returned to baseline and stable Postop Assessment: adequate PO intake Anesthetic complications: no   No notable events documented.   Last Vitals:  Vitals:   01/04/24 1010 01/04/24 1020  BP: 129/63 130/79  Pulse: 65   Resp: 19 13  Temp:    SpO2: 100%     Last Pain:  Vitals:   01/04/24 1010  TempSrc:   PainSc: 0-No pain                 Dorothey Gate

## 2024-01-04 NOTE — H&P (Signed)
 Outpatient short stay form Pre-procedure 01/04/2024  Shane Darling, MD  Primary Physician: Copland, Alicia B, PA-C  Reason for visit:  Dysphagia/Colon cancer screening  History of present illness:    63 y/o lady with history of obesity and anxiety here for EGD for solid food dysphagia and colonoscopy for screening. Last colonoscopy 10 years ago was normal. No blood thinners. No significant abdominal surgeries. No family history of GI malignancies.    Current Facility-Administered Medications:    0.9 %  sodium chloride  infusion, , Intravenous, Continuous, Moya Duan, Leanora Prophet, MD, Last Rate: 20 mL/hr at 01/04/24 0920, New Bag at 01/04/24 0920  Medications Prior to Admission  Medication Sig Dispense Refill Last Dose/Taking   ARIPiprazole (ABILIFY) 5 MG tablet Take by mouth.   01/03/2024   cholecalciferol (VITAMIN D ) 1000 units tablet Take 1,000 Units by mouth daily.   01/03/2024   estradiol  (ESTRACE  VAGINAL) 0.1 MG/GM vaginal cream Apply fingertip amount to vaginal opening and urethra 3 times weekly 42.5 g 0 01/03/2024   omeprazole (PRILOSEC) 40 MG capsule Take 40 mg by mouth daily.   01/03/2024   sertraline (ZOLOFT) 100 MG tablet Take 100 mg by mouth daily.    01/03/2024   tolterodine  (DETROL  LA) 4 MG 24 hr capsule TAKE 1 CAPSULE BY MOUTH EVERY DAY 30 capsule 1 01/03/2024   venlafaxine XR (EFFEXOR-XR) 75 MG 24 hr capsule Take 75 mg by mouth daily.      VRAYLAR 1.5 MG capsule Take 1.5 mg by mouth. (Patient not taking: Reported on 01/04/2024)   Not Taking     No Known Allergies   Past Medical History:  Diagnosis Date   Anxiety and depression    Granuloma annulare    Mixed incontinence urge and stress    OAB (overactive bladder)    Obesity    Rheumatic fever     Review of systems:  Otherwise negative.    Physical Exam  Gen: Alert, oriented. Appears stated age.  HEENT: PERRLA. Lungs: No respiratory distress CV: RRR Abd: soft, benign, no masses Ext: No edema    Planned  procedures: Proceed with EGD/colonoscopy. The patient understands the nature of the planned procedure, indications, risks, alternatives and potential complications including but not limited to bleeding, infection, perforation, damage to internal organs and possible oversedation/side effects from anesthesia. The patient agrees and gives consent to proceed.  Please refer to procedure notes for findings, recommendations and patient disposition/instructions.     Shane Darling, MD [facility]

## 2024-01-04 NOTE — Interval H&P Note (Signed)
 History and Physical Interval Note:  01/04/2024 9:24 AM  Bridget Villegas  has presented today for surgery, with the diagnosis of GERD Dysphagia Colon Cancer Screening.  The various methods of treatment have been discussed with the patient and family. After consideration of risks, benefits and other options for treatment, the patient has consented to  Procedure(s): COLONOSCOPY WITH PROPOFOL  (N/A) ESOPHAGOGASTRODUODENOSCOPY (EGD) WITH PROPOFOL  (N/A) as a surgical intervention.  The patient's history has been reviewed, patient examined, no change in status, stable for surgery.  I have reviewed the patient's chart and labs.  Questions were answered to the patient's satisfaction.     Shane Darling  Ok to proceed with EGD/Colonoscopy

## 2024-01-04 NOTE — Transfer of Care (Signed)
 Immediate Anesthesia Transfer of Care Note  Patient: Bridget Villegas  Procedure(s) Performed: COLONOSCOPY WITH PROPOFOL  ESOPHAGOGASTRODUODENOSCOPY (EGD) WITH PROPOFOL  POLYPECTOMY, INTESTINE  Patient Location: PACU  Anesthesia Type:General  Level of Consciousness: sedated  Airway & Oxygen Therapy: Patient Spontanous Breathing  Post-op Assessment: Report given to RN and Post -op Vital signs reviewed and stable  Post vital signs: Reviewed and stable  Last Vitals:  Vitals Value Taken Time  BP 117/65 01/04/24 1000  Temp 35.8 C 01/04/24 1000  Pulse 69 01/04/24 1000  Resp 21 01/04/24 1000  SpO2 98 % 01/04/24 1000    Last Pain:  Vitals:   01/04/24 1000  TempSrc: Temporal  PainSc:          Complications: No notable events documented.

## 2024-01-05 ENCOUNTER — Encounter: Payer: Self-pay | Admitting: Gastroenterology

## 2024-01-07 LAB — SURGICAL PATHOLOGY

## 2024-01-11 ENCOUNTER — Encounter: Payer: Self-pay | Admitting: Urology

## 2024-01-11 ENCOUNTER — Ambulatory Visit: Admitting: Urology

## 2024-01-11 VITALS — BP 133/71 | HR 75 | Ht 62.5 in | Wt 232.0 lb

## 2024-01-11 DIAGNOSIS — N3281 Overactive bladder: Secondary | ICD-10-CM | POA: Diagnosis not present

## 2024-01-11 LAB — MICROSCOPIC EXAMINATION
Bacteria, UA: NONE SEEN
RBC, Urine: NONE SEEN /HPF (ref 0–2)
WBC, UA: NONE SEEN /HPF (ref 0–5)

## 2024-01-11 LAB — URINALYSIS, COMPLETE
Bilirubin, UA: NEGATIVE
Glucose, UA: NEGATIVE
Ketones, UA: NEGATIVE
Leukocytes,UA: NEGATIVE
Nitrite, UA: NEGATIVE
Protein,UA: NEGATIVE
RBC, UA: NEGATIVE
Specific Gravity, UA: 1.01 (ref 1.005–1.030)
Urobilinogen, Ur: 0.2 mg/dL (ref 0.2–1.0)
pH, UA: 6.5 (ref 5.0–7.5)

## 2024-01-11 LAB — BLADDER SCAN AMB NON-IMAGING
Scan Result: 261
Scan Result: 30

## 2024-01-11 NOTE — Progress Notes (Signed)
 I, Bridget Villegas, acting as a scribe for Bridget Knapp, MD., have documented all relevant documentation on the behalf of Bridget Knapp, MD,as directed by  Bridget Knapp, MD while in the presence of Bridget Knapp, MD.  01/11/2024 4:28 PM   Bridget Villegas May 22, 1961 [id number]  Referring provider: Matthias Sor, PA-C [address] [place],  [place] [address]  Chief Complaint  Patient presents with   Over Active Bladder    HPI: Bridget Villegas is a 63 y.o. female referred for evaluation of overactive bladder.  Several months history of overactive bladder with symptoms of urinary frequency, urgency with an urge incontinence. She has nocturia x2-3 and occasional episodes of urge incontinence at night.  Has been treated by her gynecologist, recent exam there showed no significant abnormalities. She was on Ditropan  XL with improvement, but her symptoms are still bothersome. She was also tried on Toviaz  and is presently on Detrol -LA 4mg  daily. Efficacy of Detrol  slightly better than Ditropan , though still having bothersome symptoms. Insurance would not cover beta-3 agonists.  No history of recurrent UTIs or gross hematuria.   PMH: Past Medical History:  Diagnosis Date   Anxiety and depression    Granuloma annulare    Mixed incontinence urge and stress    OAB (overactive bladder)    Obesity    Rheumatic fever     Surgical History: Past Surgical History:  Procedure Laterality Date   CESAREAN SECTION     COLONOSCOPY  2015   COLONOSCOPY WITH PROPOFOL  N/A 01/04/2024   Procedure: COLONOSCOPY WITH PROPOFOL ;  Surgeon: Bridget Darling, MD;  Location: [facility] ENDOSCOPY;  Service: Endoscopy;  Laterality: N/A;   ESOPHAGOGASTRODUODENOSCOPY (EGD) WITH PROPOFOL  N/A 01/04/2024   Procedure: ESOPHAGOGASTRODUODENOSCOPY (EGD) WITH PROPOFOL ;  Surgeon: Bridget Darling, MD;  Location: [facility] ENDOSCOPY;  Service: Endoscopy;  Laterality: N/A;   NASAL SINUS SURGERY     POLYPECTOMY   01/04/2024   Procedure: POLYPECTOMY, INTESTINE;  Surgeon: Bridget Darling, MD;  Location: [facility] ENDOSCOPY;  Service: Endoscopy;;    Home Medications:  Allergies as of 01/11/2024   No Known Allergies      Medication List        Accurate as of Jan 11, 2024  4:28 PM. If you have any questions, ask your nurse or doctor.          STOP taking these medications    estradiol  0.1 MG/GM vaginal cream Commonly known as: ESTRACE  VAGINAL Stopped by: Bridget Villegas   omeprazole 40 MG capsule Commonly known as: PRILOSEC Stopped by: Bridget Villegas   Vraylar 1.5 MG capsule Generic drug: cariprazine Stopped by: Bridget Villegas C Bridget Villegas       TAKE these medications    ARIPiprazole 5 MG tablet Commonly known as: ABILIFY Take by mouth.   cholecalciferol 1000 units tablet Commonly known as: VITAMIN D  Take 1,000 Units by mouth daily.   cyanocobalamin  500 MCG tablet Commonly known as: VITAMIN B12 Take 500 mcg by mouth daily.   sertraline 100 MG tablet Commonly known as: ZOLOFT Take 100 mg by mouth daily.   tolterodine  4 MG 24 hr capsule Commonly known as: DETROL  LA TAKE 1 CAPSULE BY MOUTH EVERY DAY   venlafaxine XR 75 MG 24 hr capsule Commonly known as: EFFEXOR-XR Take 75 mg by mouth daily.   VITAMIN D  PO Take by mouth. 5000 u        Allergies: No Known Allergies  Family History: Family History  Problem  Relation Age of Onset   Heart attack Father    Heart disease Father    Diabetes Father    Hypertension Mother    Alzheimer's disease Mother    Breast cancer Neg Hx     Social History:  reports that she has never smoked. She has never used smokeless tobacco. She reports current alcohol use. She reports that she does not use drugs.   Physical Exam: BP 133/71   Pulse 75   Ht 5' 2.5" (1.588 m)   Wt 232 lb (105.2 kg)   BMI 41.76 kg/m   Constitutional:  Alert and oriented, No acute distress. HEENT: [place] AT Respiratory: Normal respiratory effort, no increased work  of breathing. Psychiatric: Normal mood and affect.   Urinalysis Dipstick/microscopy negative.   Assessment & Plan:    1. Overactive Bladder Initial post-void residual (PVR) was 261 mL, reduced to 30 mL after repeat scan. Failed anticholinergic therapy with Ditropan  XL, Toviaz , and Detrol -LA 4mg  daily. Gemtesa  (beta-3 agonist) was denied by insurance; however, a one-month trial with samples will be initiated. Combination therapy with Detrol -LA and Gemtesa  will be started, as FDA-approved. Patient will report back on the efficacy of combination therapy. If effective, an appeal to the insurance company for Gemtesa  coverage will be recommended. Discussed second-line treatment options, including Botox injections, implantable nerve stimulators, implantable neuromodulation, and percutaneous tibial nerve stimulation (PTNS).  I have reviewed the above documentation for accuracy and completeness, and I agree with the above.   Bridget Knapp, MD  [facility] [address] [place], [place] [address] [phone]

## 2024-01-11 NOTE — Patient Instructions (Signed)

## 2024-01-12 ENCOUNTER — Other Ambulatory Visit: Payer: Self-pay | Admitting: Obstetrics and Gynecology

## 2024-01-12 DIAGNOSIS — N3941 Urge incontinence: Secondary | ICD-10-CM

## 2024-01-12 DIAGNOSIS — N3281 Overactive bladder: Secondary | ICD-10-CM

## 2024-01-13 ENCOUNTER — Encounter: Payer: Self-pay | Admitting: Urology

## 2024-02-05 ENCOUNTER — Other Ambulatory Visit: Payer: Self-pay | Admitting: *Deleted

## 2024-02-05 MED ORDER — SOLIFENACIN SUCCINATE 10 MG PO TABS: 10.0000 mg | ORAL_TABLET | Freq: Every day | ORAL | 2 refills | Status: DC

## 2024-02-05 NOTE — Telephone Encounter (Signed)
 Notified patient as instructed,sent medication

## 2024-04-14 ENCOUNTER — Ambulatory Visit: Admitting: Urology

## 2024-04-28 ENCOUNTER — Encounter: Payer: Self-pay | Admitting: Urology

## 2024-04-28 ENCOUNTER — Ambulatory Visit (INDEPENDENT_AMBULATORY_CARE_PROVIDER_SITE_OTHER): Admitting: Urology

## 2024-04-28 VITALS — BP 130/73 | HR 66 | Ht 62.5 in | Wt 240.8 lb

## 2024-04-28 DIAGNOSIS — N3946 Mixed incontinence: Secondary | ICD-10-CM

## 2024-04-28 DIAGNOSIS — N3281 Overactive bladder: Secondary | ICD-10-CM | POA: Diagnosis not present

## 2024-04-28 LAB — URINALYSIS, COMPLETE
Bilirubin, UA: NEGATIVE
Glucose, UA: NEGATIVE
Ketones, UA: NEGATIVE
Leukocytes,UA: NEGATIVE
Nitrite, UA: NEGATIVE
Protein,UA: NEGATIVE
RBC, UA: NEGATIVE
Specific Gravity, UA: 1.01 (ref 1.005–1.030)
Urobilinogen, Ur: 0.2 mg/dL (ref 0.2–1.0)
pH, UA: 6 (ref 5.0–7.5)

## 2024-04-28 LAB — MICROSCOPIC EXAMINATION: Epithelial Cells (non renal): >10 /HPF — AB (ref 0–10)

## 2024-04-28 NOTE — Patient Instructions (Signed)

## 2024-04-28 NOTE — Progress Notes (Signed)
 04/28/2024 10:56 AM   Bridget Villegas 09-18-1960 [id number]  Referring provider: Watt Bernarda NOVAK, PA-C [address] [place],  [place] [address]  Chief Complaint  Patient presents with   Other    Discuss botox    HPI: Dr. Twylla: Partial responder to oxybutynin .  Failed Toviaz  and was on Detrol .  Detrol  a bit better.  Insurance did not cover beta 3 agonist.  Postvoid residual was 30 mL.  Gemtesa  was added to Detrol  and third line therapies were discussed.  Today The patient primary symptom is urge incontinence.  She does leak with coughing sneezing bending lifting.  No bedwetting.  Does not wear a pad.  Voids every 2 hours and gets up 3-4 times at night.  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  Flow was good.  She can double void a small amount  She is currently taking Vesicare  10 mg with minimal benefit.  Gemtesa  helped a little bit but was too expensive  No hysterectomy  No neurologic issues.  No history of kidney stones bladder surgery or recurrent bladder infections   PMH: Past Medical History:  Diagnosis Date   Anxiety and depression    Granuloma annulare    Mixed incontinence urge and stress    OAB (overactive bladder)    Obesity    Rheumatic fever     Surgical History: Past Surgical History:  Procedure Laterality Date   CESAREAN SECTION     COLONOSCOPY  2015   COLONOSCOPY WITH PROPOFOL  N/A 01/04/2024   Procedure: COLONOSCOPY WITH PROPOFOL ;  Surgeon: Maryruth Ole DASEN, MD;  Location: [facility];  Service: Endoscopy;  Laterality: N/A;   ESOPHAGOGASTRODUODENOSCOPY (EGD) WITH PROPOFOL  N/A 01/04/2024   Procedure: ESOPHAGOGASTRODUODENOSCOPY (EGD) WITH PROPOFOL ;  Surgeon: Maryruth Ole DASEN, MD;  Location: [facility];  Service: Endoscopy;  Laterality: N/A;   NASAL SINUS SURGERY     POLYPECTOMY  01/04/2024   Procedure: POLYPECTOMY, INTESTINE;  Surgeon: Maryruth Ole DASEN, MD;  Location: [facility];  Service: Endoscopy;;    Home  Medications:  Allergies as of 04/28/2024   No Known Allergies      Medication List        Accurate as of April 28, 2024 10:56 AM. If you have any questions, ask your nurse or doctor.          ARIPiprazole 5 MG tablet Commonly known as: ABILIFY Take by mouth.   cholecalciferol 1000 units tablet Commonly known as: VITAMIN D  Take 1,000 Units by mouth daily.   cyanocobalamin  500 MCG tablet Commonly known as: VITAMIN B12 Take 500 mcg by mouth daily.   sertraline 100 MG tablet Commonly known as: ZOLOFT Take 100 mg by mouth daily.   solifenacin  10 MG tablet Commonly known as: VESICARE  Take 1 tablet (10 mg total) by mouth daily.   tolterodine  4 MG 24 hr capsule Commonly known as: DETROL  LA TAKE 1 CAPSULE BY MOUTH EVERY DAY   venlafaxine XR 75 MG 24 hr capsule Commonly known as: EFFEXOR-XR Take 75 mg by mouth daily.   VITAMIN D  PO Take by mouth. 5000 u        Allergies: No Known Allergies  Family History: Family History  Problem Relation Age of Onset   Heart attack Father    Heart disease Father    Diabetes Father    Hypertension Mother    Alzheimer's disease Mother    Breast cancer Neg Hx     Social History:  reports that she has never smoked.  She has never used smokeless tobacco. She reports current alcohol use. She reports that she does not use drugs.  ROS:                                        Physical Exam: BP 130/73   Pulse 66   Ht 5' 2.5 (1.588 m)   Wt 109.2 kg   BMI 43.34 kg/m   Constitutional:  Alert and oriented, No acute distress. HEENT: [place] AT, moist mucus membranes.  Trachea midline, no masses.   Laboratory Data: Lab Results  Component Value Date   WBC 6.9 06/27/2019   HGB 15.6 (H) 06/27/2019   HCT 46.9 (H) 06/27/2019   MCV 94.5 06/27/2019   PLT 324.0 06/27/2019    Lab Results  Component Value Date   CREATININE 0.66 06/27/2019    No results found for: PSA  No results found for:  TESTOSTERONE  Lab Results  Component Value Date   HGBA1C 5.5 06/27/2019    Urinalysis    Component Value Date/Time   APPEARANCEUR Clear 01/11/2024 1306   GLUCOSEU Negative 01/11/2024 1306   BILIRUBINUR Negative 01/11/2024 1306   PROTEINUR Negative 01/11/2024 1306   NITRITE Negative 01/11/2024 1306   LEUKOCYTESUR Negative 01/11/2024 1306    Pertinent Imaging: Urine reviewed and sent for culture chart reviewed  Assessment & Plan:   Patient has mixed incontinence but primarily urge incontinence and sometimes foot on the floor syndrome.  She has frequency both day and night.  She will have urodynamics come back for repeat pelvic semination cystoscopy and we will proceed accordingly.  Stay on the Vesicare   1. OAB (overactive bladder) (Primary)  - Urinalysis, Complete   No follow-ups on file.  Bridget DELENA Elizabeth, MD  [facility] [address] [place], [place] [address] [phone]

## 2024-05-04 ENCOUNTER — Other Ambulatory Visit: Payer: Self-pay | Admitting: Urology

## 2024-05-15 ENCOUNTER — Other Ambulatory Visit: Payer: Self-pay | Admitting: Urology

## 2024-05-27 ENCOUNTER — Encounter: Payer: Self-pay | Admitting: Urology

## 2024-05-27 MED ORDER — SOLIFENACIN SUCCINATE 10 MG PO TABS: 10.0000 mg | ORAL_TABLET | Freq: Every day | ORAL | 6 refills | Status: AC

## 2024-05-27 NOTE — Addendum Note (Signed)
 Addended byBETHA CORIE PLATER on: 05/27/2024 03:56 PM   Modules accepted: Orders

## 2024-06-16 ENCOUNTER — Encounter: Payer: Self-pay | Admitting: Urology

## 2024-07-07 ENCOUNTER — Ambulatory Visit: Admitting: Urology

## 2024-07-27 NOTE — Progress Notes (Unsigned)
 No chief complaint on file.   HPI:      [name] is a 63 y.o. [phone] who LMP was No LMP recorded. Patient is postmenopausal., presents today for her annual examination. Her menses are absent due to menopause. She does not have PMB. She does not have vasomotor sx.    Sex activity: not sexually active. She does not have vaginal dryness/sx.   Last Pap: 07/14/21  Results were: no abnormalities /neg HPV DNA.  Hx of STDs: none   Last mammogram: 08/23/23 Results were: normal--routine follow-up in 12 months There is no FH of breast cancer. There is no FH of ovarian cancer. The patient does self-breast exams.    Colonoscopy: colonoscopy 4/25 at [facility]; 2015 without abnormalities. Repeat due after 10 yrs.???   Tobacco use: The patient denies current or previous tobacco use. Alcohol use: occas No drug use Exercise: min active; plans to start exercising again    She does not get adequate calcium but does get Vitamin D  in her diet.   She has a hx of urinary urgency/OAB sx with urge incont. Doing ditropan  XL 15 mg with sx improvement but could be better. Would be interested in other medication but unsure of insurance coverage. She occas has SUI sx with cough. No hx of glaucoma.  Labs with PCP  Past Medical History:  Diagnosis Date  . Anxiety and depression   . Granuloma annulare   . Mixed incontinence urge and stress   . OAB (overactive bladder)   . Obesity   . Rheumatic fever     Past Surgical History:  Procedure Laterality Date  . CESAREAN SECTION    . COLONOSCOPY  2015  . COLONOSCOPY WITH PROPOFOL  N/A 01/04/2024   Procedure: COLONOSCOPY WITH PROPOFOL ;  Surgeon: Maryruth Ole DASEN, MD;  Location: [facility] ENDOSCOPY;  Service: Endoscopy;  Laterality: N/A;  . ESOPHAGOGASTRODUODENOSCOPY (EGD) WITH PROPOFOL  N/A 01/04/2024   Procedure: ESOPHAGOGASTRODUODENOSCOPY (EGD) WITH PROPOFOL ;  Surgeon: Maryruth Ole DASEN, MD;  Location: [facility] ENDOSCOPY;  Service: Endoscopy;  Laterality:  N/A;  . NASAL SINUS SURGERY    . POLYPECTOMY  01/04/2024   Procedure: POLYPECTOMY, INTESTINE;  Surgeon: Maryruth Ole DASEN, MD;  Location: [facility] ENDOSCOPY;  Service: Endoscopy;;    Family History  Problem Relation Age of Onset  . Heart attack Father   . Heart disease Father   . Diabetes Father   . Hypertension Mother   . Alzheimer's disease Mother   . Breast cancer Neg Hx     Social History   Socioeconomic History  . Marital status: Legally Separated    Spouse name: Not on file  . Number of children: Not on file  . Years of education: Not on file  . Highest education level: Not on file  Occupational History  . Not on file  Tobacco Use  . Smoking status: Never  . Smokeless tobacco: Never  Vaping Use  . Vaping status: Never Used  Substance and Sexual Activity  . Alcohol use: Yes    Comment: occ  . Drug use: No  . Sexual activity: Not Currently    Birth control/protection: Post-menopausal  Other Topics Concern  . Not on file  Social History Narrative   Divorced.   Twin girls, 1 son.   Works as a Engineer, [employer]   Enjoys swimming, walking, spending time with friends.   Social Drivers of Health   Financial Resource Strain: High Risk (06/10/2024)   [facility]  Overall Physicist, Medical Strain (CARDIA)   . Difficulty of Paying Living Expenses: Hard  Food Insecurity: Food Insecurity Present (06/10/2024)   Received from [facility]   Hunger Vital Sign   . Within the past 12 months, you worried that your food would run out before you got the money to buy more.: Sometimes true   . Within the past 12 months, the food you bought just didn't last and you didn't have money to get more.: Sometimes true  Transportation Needs: No Transportation Needs (06/10/2024)   Received from [facility]   PRAPARE - Transportation   . In the past 12 months, has lack of transportation kept you from medical  appointments or from getting medications?: No   . Lack of Transportation (Non-Medical): No  Physical Activity: Insufficiently Active (02/19/2019)   Exercise Vital Sign   . Days of Exercise per Week: 2 days   . Minutes of Exercise per Session: 50 min  Stress: Not on file  Social Connections: Not on file  Intimate Partner Violence: Not on file    Current Outpatient Medications on File Prior to Visit  Medication Sig Dispense Refill  . ARIPiprazole (ABILIFY) 5 MG tablet Take by mouth.    . cholecalciferol (VITAMIN D ) 1000 units tablet Take 1,000 Units by mouth daily.    . cyanocobalamin  (VITAMIN B12) 500 MCG tablet Take 500 mcg by mouth daily.    . sertraline (ZOLOFT) 100 MG tablet Take 100 mg by mouth daily.     . solifenacin  (VESICARE ) 10 MG tablet Take 1 tablet (10 mg total) by mouth daily. 30 tablet 6  . tolterodine  (DETROL  LA) 4 MG 24 hr capsule TAKE 1 CAPSULE BY MOUTH EVERY DAY 30 capsule 1  . venlafaxine XR (EFFEXOR-XR) 75 MG 24 hr capsule Take 75 mg by mouth daily.    . VITAMIN D  PO Take by mouth. 5000 u     No current facility-administered medications on file prior to visit.      ROS:  Review of Systems  Constitutional:  Positive for fatigue. Negative for fever and unexpected weight change.  Respiratory:  Negative for cough, shortness of breath and wheezing.   Cardiovascular:  Negative for chest pain, palpitations and leg swelling.  Gastrointestinal:  Negative for blood in stool, constipation, diarrhea, nausea and vomiting.  Endocrine: Negative for cold intolerance, heat intolerance and polyuria.  Genitourinary:  Negative for dyspareunia, dysuria, flank pain, frequency, genital sores, hematuria, menstrual problem, pelvic pain, urgency, vaginal bleeding, vaginal discharge and vaginal pain.  Musculoskeletal:  Negative for back pain, joint swelling and myalgias.  Skin:  Positive for rash.  Neurological:  Negative for dizziness, syncope, light-headedness, numbness and headaches.   Hematological:  Negative for adenopathy.  Psychiatric/Behavioral:  Positive for agitation and dysphoric mood. Negative for confusion, sleep disturbance and suicidal ideas. The patient is not nervous/anxious.      Objective: There were no vitals taken for this visit.   Physical Exam Constitutional:      Appearance: She is well-developed.  Genitourinary:     Vulva normal.     Right Labia: No rash, tenderness or lesions.    Left Labia: No tenderness, lesions or rash.    No vaginal discharge, erythema or tenderness.     Moderate vaginal atrophy present.     Right Adnexa: not tender and no mass present.    Left Adnexa: not tender and no mass present.    No cervical friability or polyp.  Uterus is not enlarged or tender.  Breasts:    Right: No mass, nipple discharge, skin change or tenderness.     Left: No mass, nipple discharge, skin change or tenderness.  Neck:     Thyroid : No thyromegaly.  Cardiovascular:     Rate and Rhythm: Normal rate and regular rhythm.     Heart sounds: Normal heart sounds. No murmur heard. Pulmonary:     Effort: Pulmonary effort is normal.     Breath sounds: Normal breath sounds.  Abdominal:     Palpations: Abdomen is soft.     Tenderness: There is no abdominal tenderness. There is no guarding or rebound.  Musculoskeletal:        General: Normal range of motion.     Cervical back: Normal range of motion.  Lymphadenopathy:     Cervical: No cervical adenopathy.  Neurological:     General: No focal deficit present.     Mental Status: She is alert and oriented to person, place, and time.     Cranial Nerves: No cranial nerve deficit.  Skin:    General: Skin is warm and dry.  Psychiatric:        Mood and Affect: Mood normal.        Behavior: Behavior normal.        Thought Content: Thought content normal.        Judgment: Judgment normal.  Vitals reviewed.      Assessment/Plan: Encounter for annual routine gynecological  examination  Encounter for screening mammogram for malignant neoplasm of breast - Plan: MM 3D SCREENING MAMMOGRAM BILATERAL BREAST  OAB (overactive bladder) - Plan: oxybutynin  (DITROPAN  XL) 15 MG 24 hr tablet; sx improved with ditropan . Rx eRxd. Pt to look into insurance coverage for myrbetriq and gemtesa . Will change Rx if affordable for pt. Pt to f/u.    No orders of the defined types were placed in this encounter.            GYN counsel breast self exam, mammography screening, menopause, adequate intake of calcium and vitamin D , diet and exercise     F/U  No follow-ups on file.  Gilbert Manolis B. Jasmine Maceachern, PA-C 07/27/2024 3:41 PM

## 2024-07-28 ENCOUNTER — Other Ambulatory Visit: Payer: Self-pay | Admitting: Family Medicine

## 2024-07-28 ENCOUNTER — Encounter: Payer: Self-pay | Admitting: Obstetrics and Gynecology

## 2024-07-28 ENCOUNTER — Other Ambulatory Visit (HOSPITAL_COMMUNITY)
Admission: RE | Admit: 2024-07-28 | Discharge: 2024-07-28 | Disposition: A | Discharge status: Home / Self Care | Source: Ambulatory Visit | Attending: Obstetrics and Gynecology | Admitting: Obstetrics and Gynecology

## 2024-07-28 ENCOUNTER — Ambulatory Visit: Admitting: Obstetrics and Gynecology

## 2024-07-28 VITALS — BP 130/72 | HR 63 | Ht 62.5 in | Wt 241.0 lb

## 2024-07-28 DIAGNOSIS — N3281 Overactive bladder: Secondary | ICD-10-CM | POA: Diagnosis not present

## 2024-07-28 DIAGNOSIS — Z01411 Encounter for gynecological examination (general) (routine) with abnormal findings: Secondary | ICD-10-CM

## 2024-07-28 DIAGNOSIS — Z1231 Encounter for screening mammogram for malignant neoplasm of breast: Secondary | ICD-10-CM

## 2024-07-28 DIAGNOSIS — Z124 Encounter for screening for malignant neoplasm of cervix: Secondary | ICD-10-CM

## 2024-07-28 DIAGNOSIS — B354 Tinea corporis: Secondary | ICD-10-CM | POA: Diagnosis not present

## 2024-07-28 DIAGNOSIS — Z01419 Encounter for gynecological examination (general) (routine) without abnormal findings: Secondary | ICD-10-CM

## 2024-07-28 MED ORDER — CLOTRIMAZOLE-BETAMETHASONE 1-0.05 % EX CREA: TOPICAL_CREAM | CUTANEOUS | 0 refills | Status: AC

## 2024-07-28 NOTE — Patient Instructions (Signed)
 I value your feedback and you entrusting [place] with your care. If you get a [facility] patient survey, I would appreciate you taking the time to let us know about your experience today. Thank you!  [facility] ([place])--[phone]

## 2024-07-29 LAB — CYTOLOGY - PAP: Diagnosis: NEGATIVE

## 2024-08-11 ENCOUNTER — Ambulatory Visit: Admitting: Urology

## 2024-08-11 VITALS — BP 124/69 | HR 71 | Ht 62.5 in | Wt 236.0 lb

## 2024-08-11 DIAGNOSIS — N3281 Overactive bladder: Secondary | ICD-10-CM | POA: Diagnosis not present

## 2024-08-11 DIAGNOSIS — N3946 Mixed incontinence: Secondary | ICD-10-CM | POA: Diagnosis not present

## 2024-08-11 MED ORDER — LIDOCAINE HCL URETHRAL/MUCOSAL 2 % EX GEL
1.0000 | Freq: Once | CUTANEOUS | Status: AC
  Administered 2024-08-11: 1 via URETHRAL

## 2024-08-11 NOTE — Progress Notes (Signed)
 08/11/2024 10:57 AM   Bridget Villegas [birth year] [id number]  Referring provider: Watt Bernarda NOVAK, PA-C [address] [place],  [place] [address]  Chief Complaint  Patient presents with   Cysto    HPI: Dr. Twylla: Partial responder to oxybutynin .  Failed Toviaz  and was on Detrol .  Detrol  a bit better.  Insurance did not cover beta 3 agonist.  Postvoid residual was 30 mL.  Gemtesa  was added to Detrol  and third line therapies were discussed.   Today The patient primary symptom is urge incontinence.  She does leak with coughing sneezing bending lifting.  No bedwetting.  Does not wear a pad.   Voids every 2 hours and gets up 3-4 times at night.  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   Flow was good.  She can double void a small amount   She is currently taking Vesicare  10 mg with minimal benefit.  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 She has frequency both day and night. She will have urodynamics come back for repeat pelvic examination cystoscopy and we will proceed accordingly. Stay on the Vesicare    Today Frequency stable.  Incontinence stable.  Clinically not infected On pelvic examination she grade to hypermobility but a negative cough test with a moderate cough after cystoscopy.  Minimal cystocele with no hinge effect Patient underwent flexible cystoscopy.  Bladder mucosa and trigone were normal.  No cystitis.  No carcinoma.  Few white flecks in urine sent for culture.  Urinalysis negative  On urodynamics patient voided 206 mL.  Maximum flow 19 mL/s.  She had prolonged interrupted pattern in keeping with possible straining and her residual was a few milliliters.  Maximum bladder capacity was 334 mL.  She increased bladder sensation.  Her bladder was stable at 330 mL her Valsalva leak point pressure was 47 cm of water with  mild leakage.  She only leaked once during the study.  At lower volumes she generated pressures of approximately 70 cm of water and had no stress incontinence during voiding she voided 334 mL.  Maximal flow was 16 mL/s.  It was noted that her voiding pressure is 15 cm of water but I think it was straining artifact.  I do not believe the patient was actually generating a contraction.  Residual was 0 mL.  Bladder neck to send at 1 cm.     PMH: Past Medical History:  Diagnosis Date   Anxiety and depression    Granuloma annulare    Mixed incontinence urge and stress    OAB (overactive bladder)    Obesity    Rheumatic fever     Surgical History: Past Surgical History:  Procedure Laterality Date   CESAREAN SECTION     COLONOSCOPY  2015   COLONOSCOPY WITH PROPOFOL  N/A 01/04/2024   Procedure: COLONOSCOPY WITH PROPOFOL ;  Surgeon: Maryruth Ole DASEN, MD;  Location: [facility];  Service: Endoscopy;  Laterality: N/A;   ESOPHAGOGASTRODUODENOSCOPY (EGD) WITH PROPOFOL  N/A 01/04/2024   Procedure: ESOPHAGOGASTRODUODENOSCOPY (EGD) WITH PROPOFOL ;  Surgeon: Maryruth Ole DASEN, MD;  Location: [facility];  Service: Endoscopy;  Laterality: N/A;   NASAL SINUS SURGERY     POLYPECTOMY  01/04/2024   Procedure: POLYPECTOMY, INTESTINE;  Surgeon: Maryruth Ole DASEN, MD;  Location: [facility];  Service: Endoscopy;;    Home Medications:  Allergies as of 08/11/2024  No Known Allergies      Medication List        Accurate as of August 11, 2024 10:57 AM. If you have any questions, ask your nurse or doctor.          ARIPiprazole 5 MG tablet Commonly known as: ABILIFY Take by mouth.   cholecalciferol 1000 units tablet Commonly known as: VITAMIN D  Take 1,000 Units by mouth daily.   clotrimazole -betamethasone  cream Commonly known as: LOTRISONE  Apply externally BID prn sx up to 2 wks   cyanocobalamin  500 MCG tablet Commonly known as: VITAMIN B12 Take 500 mcg by mouth daily.    pantoprazole 20 MG tablet Commonly known as: PROTONIX Take 20 mg by mouth daily.   sertraline 100 MG tablet Commonly known as: ZOLOFT Take 100 mg by mouth daily.   solifenacin  10 MG tablet Commonly known as: VESICARE  Take 1 tablet (10 mg total) by mouth daily.   tolterodine  4 MG 24 hr capsule Commonly known as: DETROL  LA TAKE 1 CAPSULE BY MOUTH EVERY DAY   venlafaxine XR 150 MG 24 hr capsule Commonly known as: EFFEXOR-XR Take 150 mg by mouth daily.   VITAMIN D  PO Take by mouth. 5000 u        Allergies: No Known Allergies  Family History: Family History  Problem Relation Age of Onset   Heart attack Father    Heart disease Father    Diabetes Father    Hypertension Mother    Alzheimer's disease Mother    Breast cancer Neg Hx     Social History:  reports that she has never smoked. She has never used smokeless tobacco. She reports current alcohol use. She reports that she does not use drugs.  ROS:                                        Physical Exam: BP 124/69 (BP Location: Left Arm, Patient Position: Sitting, Cuff Size: Large)   Pulse 71   Ht 5' 2.5 (1.588 m)   Wt 107 kg   SpO2 97%   BMI 42.48 kg/m   Constitutional:  Alert and oriented, No acute distress. HEENT:  AT, moist mucus membranes.  Trachea midline, no masses.   Laboratory Data: Lab Results  Component Value Date   WBC 6.9 06/27/2019   HGB 15.6 (H) 06/27/2019   HCT 46.9 (H) 06/27/2019   MCV 94.5 06/27/2019   PLT 324.0 06/27/2019    Lab Results  Component Value Date   CREATININE 0.66 06/27/2019    No results found for: PSA  No results found for: TESTOSTERONE  Lab Results  Component Value Date   HGBA1C 5.5 06/27/2019    Urinalysis    Component Value Date/Time   APPEARANCEUR Clear 04/28/2024 1036   GLUCOSEU Negative 04/28/2024 1036   BILIRUBINUR Negative 04/28/2024 1036   PROTEINUR Negative 04/28/2024 1036   NITRITE Negative 04/28/2024 1036    LEUKOCYTESUR Negative 04/28/2024 1036    Pertinent Imaging:   Assessment & Plan: Patient has mixed incontinence but primarily refractory overactive bladder.  Her symptoms are less severe.  3 refractory treatments discussed Patient would like to go ahead with Botox and I think that is a good choice.  Catheterizing may be difficult based on body habitus but I think is a good choice.  3-day ciprofloxacin prescription sent.  Proceed accordingly.  Handout given  1. Mixed incontinence (Primary)  -  Urinalysis, Complete - lidocaine  (XYLOCAINE ) 2 % jelly 1 Application  2. OAB (overactive bladder)  - Urinalysis, Complete - lidocaine  (XYLOCAINE ) 2 % jelly 1 Application   No follow-ups on file.  Glendia DELENA Elizabeth, MD  [facility] [address] [place], [place] [address] [phone]

## 2024-08-12 LAB — MICROSCOPIC EXAMINATION

## 2024-08-12 LAB — URINALYSIS, COMPLETE
Bilirubin, UA: NEGATIVE
Glucose, UA: NEGATIVE
Ketones, UA: NEGATIVE
Leukocytes,UA: NEGATIVE
Nitrite, UA: NEGATIVE
Protein,UA: NEGATIVE
RBC, UA: NEGATIVE
Specific Gravity, UA: 1.025 (ref 1.005–1.030)
Urobilinogen, Ur: 0.2 mg/dL (ref 0.2–1.0)
pH, UA: 6 (ref 5.0–7.5)

## 2024-08-14 ENCOUNTER — Ambulatory Visit: Payer: Self-pay

## 2024-08-14 ENCOUNTER — Encounter: Payer: Self-pay | Admitting: Urology

## 2024-08-14 LAB — CULTURE, URINE COMPREHENSIVE

## 2024-08-18 MED ORDER — NITROFURANTOIN MACROCRYSTAL 100 MG PO CAPS: 100.0000 mg | ORAL_CAPSULE | Freq: Two times a day (BID) | ORAL | 0 refills | Status: AC

## 2024-08-18 NOTE — Telephone Encounter (Signed)
 Pt informed of urine culture results, per Dr.MacDiarmid an antibiotic was sent in to pt's preferred pharmacy, pt voiced understanding.

## 2024-09-08 ENCOUNTER — Ambulatory Visit
Admission: RE | Admit: 2024-09-08 | Discharge: 2024-09-08 | Disposition: A | Discharge status: Home / Self Care | Source: Ambulatory Visit | Attending: Family Medicine | Admitting: Family Medicine

## 2024-09-08 DIAGNOSIS — Z1231 Encounter for screening mammogram for malignant neoplasm of breast: Secondary | ICD-10-CM | POA: Insufficient documentation

## 2024-10-07 ENCOUNTER — Other Ambulatory Visit: Payer: Self-pay

## 2024-10-07 DIAGNOSIS — N3946 Mixed incontinence: Secondary | ICD-10-CM

## 2024-10-20 ENCOUNTER — Other Ambulatory Visit

## 2024-11-03 ENCOUNTER — Ambulatory Visit: Admitting: Urology
